# Patient Record
Sex: Female | Born: 1995 | Race: Black or African American | Hispanic: No | Marital: Single | Smoking: Never smoker
Health system: Southern US, Community
[De-identification: ages and names within clinical notes are randomized; demographics above are authoritative.]

## PROBLEM LIST (undated history)

## (undated) ENCOUNTER — Emergency Department (HOSPITAL_COMMUNITY): Admission: EM | Payer: Medicaid Other | Source: Home / Self Care

## (undated) HISTORY — PX: EYE SURGERY: SHX253

---
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
## 2015-04-26 ENCOUNTER — Encounter (HOSPITAL_COMMUNITY): Payer: Self-pay | Admitting: Emergency Medicine

## 2015-04-26 ENCOUNTER — Emergency Department (HOSPITAL_COMMUNITY)
Admission: EM | Admit: 2015-04-26 | Discharge: 2015-04-26 | Disposition: A | Discharge status: Home / Self Care | Payer: Medicaid Other | Attending: Emergency Medicine | Admitting: Emergency Medicine

## 2015-04-26 DIAGNOSIS — Z720 Tobacco use: Secondary | ICD-10-CM | POA: Insufficient documentation

## 2015-04-26 DIAGNOSIS — J069 Acute upper respiratory infection, unspecified: Secondary | ICD-10-CM | POA: Insufficient documentation

## 2015-04-26 DIAGNOSIS — B9789 Other viral agents as the cause of diseases classified elsewhere: Secondary | ICD-10-CM

## 2015-04-26 MED ORDER — OXYMETAZOLINE HCL 0.05 % NA SOLN: 1.0000 | Freq: Two times a day (BID) | NASAL | Status: DC

## 2015-04-26 MED ORDER — IBUPROFEN 600 MG PO TABS: 600.0000 mg | ORAL_TABLET | Freq: Four times a day (QID) | ORAL | Status: DC | PRN

## 2015-04-26 MED ORDER — PSEUDOEPHEDRINE HCL 60 MG PO TABS: 60.0000 mg | ORAL_TABLET | Freq: Four times a day (QID) | ORAL | Status: DC | PRN

## 2015-04-26 NOTE — Discharge Instructions (Signed)
Please read and follow all provided instructions.  Your diagnoses today include:  1. Viral URI with cough    You appear to have an upper respiratory infection (URI). An upper respiratory tract infection, or cold, is a viral infection of the air passages leading to the lungs. It should improve gradually after 5-7 days. You may have a lingering cough that lasts for 2- 4 weeks after the infection.  Tests performed today include:  Vital signs. See below for your results today.   Medications prescribed:   Oxymetazoline - nasal spray for congestion. Do not use for more than 3 days because this medicine can cause rebound congestion.    Ibuprofen (Motrin, Advil) - anti-inflammatory pain medication  Do not exceed 600mg  ibuprofen every 6 hours, take with food  You have been prescribed an anti-inflammatory medication or NSAID. Take with food. Take smallest effective dose for the shortest duration needed for your pain. Stop taking if you experience stomach pain or vomiting.   Take any prescribed medications only as directed. Treatment for your infection is aimed at treating the symptoms. There are no medications, such as antibiotics, that will cure your infection.   Home care instructions:  Follow any educational materials contained in this packet.   Your illness is contagious and can be spread to others, especially during the first 3 or 4 days. It cannot be cured by antibiotics or other medicines. Take basic precautions such as washing your hands often, covering your mouth when you cough or sneeze, and avoiding public places where you could spread your illness to others.   Please continue drinking plenty of fluids.  Use over-the-counter medicines as needed as directed on packaging for symptom relief.  You may also use ibuprofen or tylenol as directed on packaging for pain or fever.  Do not take multiple medicines containing Tylenol or acetaminophen to avoid taking too much of this  medication.  Follow-up instructions: Please follow-up with your primary care provider in the next 3 days for further evaluation of your symptoms if you are not feeling better.   Return instructions:   Please return to the Emergency Department if you experience worsening symptoms.   RETURN IMMEDIATELY IF you develop shortness of breath, confusion or altered mental status, a new rash, become dizzy, faint, or poorly responsive, or are unable to be cared for at home.  Please return if you have persistent vomiting and cannot keep down fluids or develop a fever that is not controlled by tylenol or motrin.    Please return if you have any other emergent concerns.  Additional Information:  Your vital signs today were: BP 124/72 mmHg   Pulse 86   Temp(Src) 98 F (36.7 C) (Oral)   Resp 20   SpO2 100%   LMP 03/29/2015 If your blood pressure (BP) was elevated above 135/85 this visit, please have this repeated by your doctor within one month. --------------

## 2015-04-26 NOTE — ED Notes (Signed)
Pt. Stated, I started having cold symptoms yesterday and now I have a cough.

## 2015-04-26 NOTE — ED Provider Notes (Signed)
CSN: [id number]     Arrival date & time 04/26/15  1557 History  This chart was scribed for non-physician practitioner Renne CriglerJoshua Raahi Korber, PA-C working with Jerelyn ScottMartha Linker, MD by Murriel HopperAlec Bankhead, ED Scribe. This patient was seen in room TR08C/TR08C and the patient's care was started at 5:03 PM.  Chief Complaint  Patient presents with  . Nasal Congestion     The history is provided by the patient. No language interpreter was used.     HPI Comments: Angelica Bowers is a 19 y.o. female who presents to the Emergency Department complaining of constant nasal congestion with associated cough that has been present since yesterday. Pt states she began to have congestion yesterday and took antihistamines twice which provided moderate relief. Pt denies fever, ear pain, sore throat, nausea, vomiting, diarrhea.     History reviewed. No pertinent past medical history. History reviewed. No pertinent past surgical history. No family history on file. History  Substance Use Topics  . Smoking status: Current Some Day Smoker  . Smokeless tobacco: Not on file  . Alcohol Use: No   OB History    No data available     Review of Systems  Constitutional: Negative for fever, chills and fatigue.  HENT: Positive for congestion, postnasal drip and rhinorrhea. Negative for ear pain, sinus pressure and sore throat.   Eyes: Negative for redness.  Respiratory: Positive for cough. Negative for wheezing.   Gastrointestinal: Negative for nausea, vomiting, abdominal pain and diarrhea.  Genitourinary: Negative for dysuria.  Musculoskeletal: Negative for myalgias and neck stiffness.  Skin: Negative for rash.  Neurological: Negative for headaches.  Hematological: Negative for adenopathy.      Allergies  Review of patient's allergies indicates no known allergies.  Home Medications   Prior to Admission medications   Not on File   BP 124/72 mmHg  Pulse 86  Temp(Src) 98 F (36.7 C) (Oral)  Resp 20  SpO2 100%   LMP 03/29/2015 Physical Exam  Constitutional: She is oriented to person, place, and time. She appears well-developed and well-nourished.  HENT:  Head: Normocephalic and atraumatic.  Right Ear: Tympanic membrane, external ear and ear canal normal.  Left Ear: Tympanic membrane, external ear and ear canal normal.  Nose: Mucosal edema and rhinorrhea present.  Mouth/Throat: Uvula is midline, oropharynx is clear and moist and mucous membranes are normal. Mucous membranes are not dry. No oral lesions. No trismus in the jaw. No uvula swelling. No oropharyngeal exudate, posterior oropharyngeal edema, posterior oropharyngeal erythema or tonsillar abscesses.  Eyes: Conjunctivae are normal. Right eye exhibits no discharge. Left eye exhibits no discharge.  Neck: Normal range of motion. Neck supple.  Cardiovascular: Normal rate, regular rhythm and normal heart sounds.   Pulmonary/Chest: Effort normal and breath sounds normal. No respiratory distress. She has no wheezes. She has no rales.  Abdominal: Soft. She exhibits no distension. There is no tenderness.  Lymphadenopathy:    She has no cervical adenopathy.  Neurological: She is alert and oriented to person, place, and time.  Skin: Skin is warm and dry.  Psychiatric: She has a normal mood and affect.  Nursing note and vitals reviewed.   ED Course  Procedures (including critical care time) DIAGNOSTIC STUDIES: Oxygen Saturation is 10% on room air, normal by my interpretation.    COORDINATION OF CARE: 5:09 PM Discussed treatment plan with pt at bedside and pt agreed to plan.   Labs Review Labs Reviewed - No data to display  Imaging Review No results found.  EKG Interpretation None      Vital signs reviewed and are as follows: Filed Vitals:   04/26/15 1701  BP: 124/72  Pulse: 86  Temp: 98 F (36.7 C)  Resp: 20   Patient counseled on supportive care for viral URI and s/s to return including worsening symptoms, persistent fever,  persistent vomiting, or if they have any other concerns. Urged to see PCP if symptoms persist for more than 3 days. Patient verbalizes understanding and agrees with plan.   MDM   Final diagnoses:  Viral URI with cough    Patient with symptoms consistent with a viral syndrome. Vitals are stable, no fever. No signs of dehydration. Lung exam normal, no signs of pneumonia. Supportive therapy indicated with return if symptoms worsen.    I personally performed the services described in this documentation, which was scribed in my presence. The recorded information has been reviewed and is accurate.      Renne Crigler, PA-C 04/26/15 1713  Jerelyn Scott, MD 04/26/15 [phone]

## 2015-04-27 ENCOUNTER — Encounter (HOSPITAL_COMMUNITY): Payer: Self-pay | Admitting: Emergency Medicine

## 2015-04-27 NOTE — ED Notes (Signed)
Pt.left after being triage.

## 2015-04-27 NOTE — ED Notes (Signed)
Patient came in by ambulance for same symptoms as yesterday.   Patient tearful when I asked about what her chief complaint is today.   Patient states that she couldn't afford medication and that she just needs us to give her medication.   Patient states she lives with her grandmother and she can't help her.   Patient states that she will have to take ambulance home today.   Patient advised that she needed to get a ride home.  Patient states "I'm just leaving".   Patient asked if she needed to speak with social work to try and help.  Patient refused.   Patient states "i don't think i'm gonna stay".   Patient still in chair at this time.   Patient refused vitals.

## 2015-04-27 NOTE — ED Notes (Signed)
Per EMS "cold, flu symptoms, tired.   Patient couldn't afford the medications that she was prescribed yesterday, so came back by ambulance today".

## 2016-07-17 ENCOUNTER — Emergency Department (HOSPITAL_COMMUNITY): Payer: Self-pay

## 2016-07-17 ENCOUNTER — Emergency Department (HOSPITAL_COMMUNITY)
Admission: EM | Admit: 2016-07-17 | Discharge: 2016-07-17 | Disposition: A | Discharge status: Home / Self Care | Payer: Self-pay | Attending: Emergency Medicine | Admitting: Emergency Medicine

## 2016-07-17 ENCOUNTER — Encounter (HOSPITAL_COMMUNITY): Payer: Self-pay | Admitting: Emergency Medicine

## 2016-07-17 DIAGNOSIS — M542 Cervicalgia: Secondary | ICD-10-CM | POA: Insufficient documentation

## 2016-07-17 DIAGNOSIS — R109 Unspecified abdominal pain: Secondary | ICD-10-CM | POA: Insufficient documentation

## 2016-07-17 DIAGNOSIS — S0101XA Laceration without foreign body of scalp, initial encounter: Secondary | ICD-10-CM | POA: Insufficient documentation

## 2016-07-17 DIAGNOSIS — Y999 Unspecified external cause status: Secondary | ICD-10-CM | POA: Insufficient documentation

## 2016-07-17 DIAGNOSIS — M79642 Pain in left hand: Secondary | ICD-10-CM | POA: Insufficient documentation

## 2016-07-17 DIAGNOSIS — Y92410 Unspecified street and highway as the place of occurrence of the external cause: Secondary | ICD-10-CM | POA: Insufficient documentation

## 2016-07-17 DIAGNOSIS — Y939 Activity, unspecified: Secondary | ICD-10-CM | POA: Insufficient documentation

## 2016-07-17 DIAGNOSIS — R938 Abnormal findings on diagnostic imaging of other specified body structures: Secondary | ICD-10-CM | POA: Insufficient documentation

## 2016-07-17 DIAGNOSIS — R51 Headache: Secondary | ICD-10-CM | POA: Insufficient documentation

## 2016-07-17 DIAGNOSIS — Z79899 Other long term (current) drug therapy: Secondary | ICD-10-CM | POA: Insufficient documentation

## 2016-07-17 LAB — CBC
HCT: 39.9 % (ref 36.0–46.0)
HEMOGLOBIN: 13.1 g/dL (ref 12.0–15.0)
MCH: 29.9 pg (ref 26.0–34.0)
MCHC: 32.8 g/dL (ref 30.0–36.0)
MCV: 91.1 fL (ref 78.0–100.0)
PLATELETS: 268 10*3/uL (ref 150–400)
RBC: 4.38 MIL/uL (ref 3.87–5.11)
RDW: 13.4 % (ref 11.5–15.5)
WBC: 10.2 10*3/uL (ref 4.0–10.5)

## 2016-07-17 LAB — I-STAT CHEM 8, ED
BUN: 17 mg/dL (ref 6–20)
CALCIUM ION: 1.11 mmol/L — AB (ref 1.13–1.30)
CHLORIDE: 103 mmol/L (ref 101–111)
CREATININE: 1.1 mg/dL — AB (ref 0.44–1.00)
GLUCOSE: 112 mg/dL — AB (ref 65–99)
HCT: 43 % (ref 36.0–46.0)
Hemoglobin: 14.6 g/dL (ref 12.0–15.0)
POTASSIUM: 3.7 mmol/L (ref 3.5–5.1)
Sodium: 143 mmol/L (ref 135–145)
TCO2: 26 mmol/L (ref 0–100)

## 2016-07-17 LAB — ETHANOL

## 2016-07-17 LAB — I-STAT BETA HCG BLOOD, ED (MC, WL, AP ONLY): I-stat hCG, quantitative: <5 m[IU]/mL (ref <5)

## 2016-07-17 LAB — URINALYSIS, ROUTINE W REFLEX MICROSCOPIC
Bilirubin Urine: NEGATIVE
GLUCOSE, UA: NEGATIVE mg/dL
HGB URINE DIPSTICK: NEGATIVE
KETONES UR: NEGATIVE mg/dL
LEUKOCYTES UA: NEGATIVE
Nitrite: NEGATIVE
PROTEIN: NEGATIVE mg/dL
Specific Gravity, Urine: >1.046 — ABNORMAL HIGH (ref 1.005–1.030)
pH: 7 (ref 5.0–8.0)

## 2016-07-17 LAB — COMPREHENSIVE METABOLIC PANEL
ALK PHOS: 32 U/L — AB (ref 38–126)
ALT: 17 U/L (ref 14–54)
ANION GAP: 8 (ref 5–15)
AST: 26 U/L (ref 15–41)
Albumin: 4.4 g/dL (ref 3.5–5.0)
BILIRUBIN TOTAL: 0.3 mg/dL (ref 0.3–1.2)
BUN: 14 mg/dL (ref 6–20)
CALCIUM: 9.1 mg/dL (ref 8.9–10.3)
CO2: 27 mmol/L (ref 22–32)
CREATININE: 1.06 mg/dL — AB (ref 0.44–1.00)
Chloride: 104 mmol/L (ref 101–111)
Glucose, Bld: 115 mg/dL — ABNORMAL HIGH (ref 65–99)
Potassium: 3.8 mmol/L (ref 3.5–5.1)
Sodium: 139 mmol/L (ref 135–145)
TOTAL PROTEIN: 7 g/dL (ref 6.5–8.1)

## 2016-07-17 LAB — RAPID URINE DRUG SCREEN, HOSP PERFORMED
AMPHETAMINES: NOT DETECTED
BARBITURATES: NOT DETECTED
Benzodiazepines: NOT DETECTED
COCAINE: NOT DETECTED
OPIATES: NOT DETECTED
TETRAHYDROCANNABINOL: POSITIVE — AB

## 2016-07-17 LAB — PROTIME-INR
INR: 0.98 (ref 0.00–1.49)
Prothrombin Time: 13.2 seconds (ref 11.6–15.2)

## 2016-07-17 LAB — CDS SEROLOGY

## 2016-07-17 LAB — I-STAT CG4 LACTIC ACID, ED: LACTIC ACID, VENOUS: 3.04 mmol/L — AB (ref 0.5–1.9)

## 2016-07-17 LAB — CBG MONITORING, ED: GLUCOSE-CAPILLARY: 114 mg/dL — AB (ref 65–99)

## 2016-07-17 MED ORDER — HYDROCODONE-ACETAMINOPHEN 5-325 MG PO TABS: 2.0000 | ORAL_TABLET | Freq: Four times a day (QID) | ORAL | 0 refills | Status: DC | PRN

## 2016-07-17 MED ORDER — TETANUS-DIPHTH-ACELL PERTUSSIS 5-2.5-18.5 LF-MCG/0.5 IM SUSP
0.5000 mL | Freq: Once | INTRAMUSCULAR | Status: AC
  Administered 2016-07-17: 0.5 mL via INTRAMUSCULAR
  Filled 2016-07-17: qty 0.5

## 2016-07-17 MED ORDER — FENTANYL CITRATE (PF) 100 MCG/2ML IJ SOLN
INTRAMUSCULAR | Status: DC | PRN
  Administered 2016-07-17 (×2): 50 ug via INTRAVENOUS

## 2016-07-17 MED ORDER — LIDOCAINE HCL (PF) 1 % IJ SOLN: 5.0000 mL | Freq: Once | INTRAMUSCULAR | Status: DC

## 2016-07-17 MED ORDER — LIDOCAINE-EPINEPHRINE (PF) 2 %-1:200000 IJ SOLN
20.0000 mL | Freq: Once | INTRAMUSCULAR | Status: AC
  Administered 2016-07-17: 20 mL
  Filled 2016-07-17: qty 20

## 2016-07-17 MED ORDER — LIDOCAINE HCL (PF) 1 % IJ SOLN
INTRAMUSCULAR | Status: AC
  Filled 2016-07-17: qty 5

## 2016-07-17 MED ORDER — FENTANYL CITRATE (PF) 100 MCG/2ML IJ SOLN
50.0000 ug | Freq: Once | INTRAMUSCULAR | Status: AC
  Administered 2016-07-17: 50 ug via INTRAVENOUS

## 2016-07-17 MED ORDER — IOPAMIDOL (ISOVUE-370) INJECTION 76%
INTRAVENOUS | Status: AC
  Filled 2016-07-17: qty 100

## 2016-07-17 MED ORDER — IOPAMIDOL (ISOVUE-370) INJECTION 76%
INTRAVENOUS | Status: AC
  Administered 2016-07-17: 120 mL
  Filled 2016-07-17: qty 50

## 2016-07-17 MED ORDER — SODIUM CHLORIDE 0.9 % IV BOLUS (SEPSIS)
1000.0000 mL | Freq: Once | INTRAVENOUS | Status: AC
  Administered 2016-07-17: 1000 mL via INTRAVENOUS

## 2016-07-17 MED ORDER — ONDANSETRON HCL 4 MG/2ML IJ SOLN
4.0000 mg | Freq: Once | INTRAMUSCULAR | Status: AC
  Administered 2016-07-17: 4 mg via INTRAVENOUS

## 2016-07-17 MED ORDER — MORPHINE SULFATE (PF) 4 MG/ML IV SOLN: 4.0000 mg | Freq: Once | INTRAVENOUS | Status: DC

## 2016-07-17 MED ORDER — FENTANYL CITRATE (PF) 100 MCG/2ML IJ SOLN
INTRAMUSCULAR | Status: AC
  Filled 2016-07-17: qty 2

## 2016-07-17 MED ORDER — ONDANSETRON HCL 4 MG/2ML IJ SOLN
INTRAMUSCULAR | Status: AC
  Administered 2016-07-17: 4 mg via INTRAVENOUS
  Filled 2016-07-17: qty 2

## 2016-07-17 NOTE — Progress Notes (Signed)
Orthopedic Tech Progress Note Patient Details:  Angelica Bowers [birth year] [id number]  Patient ID: [name], female   DOB: [birth year], 20 y.o.   MRN: [id number]   Saul Fordyce 07/17/2016, 4:37 PM Reported to Level 2 Trauma.

## 2016-07-17 NOTE — ED Notes (Signed)
Family at beside. Family given emotional support. 
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
## 2016-07-17 NOTE — ED Notes (Signed)
Resident suturing and fixing teeth.
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
## 2016-07-18 LAB — SAMPLE TO BLOOD BANK

## 2016-07-19 ENCOUNTER — Encounter (HOSPITAL_COMMUNITY): Payer: Self-pay | Admitting: Emergency Medicine

## 2016-09-12 ENCOUNTER — Ambulatory Visit (INDEPENDENT_AMBULATORY_CARE_PROVIDER_SITE_OTHER): Payer: Self-pay

## 2016-09-12 ENCOUNTER — Encounter: Payer: Self-pay | Admitting: Obstetrics and Gynecology

## 2016-09-12 ENCOUNTER — Encounter (HOSPITAL_COMMUNITY): Payer: Self-pay | Admitting: *Deleted

## 2016-09-12 ENCOUNTER — Inpatient Hospital Stay (HOSPITAL_COMMUNITY)
Admission: AD | Admit: 2016-09-12 | Discharge: 2016-09-12 | Disposition: A | Discharge status: Home / Self Care | Payer: Medicaid Other | Source: Ambulatory Visit | Attending: Obstetrics and Gynecology | Admitting: Obstetrics and Gynecology

## 2016-09-12 DIAGNOSIS — Z711 Person with feared health complaint in whom no diagnosis is made: Secondary | ICD-10-CM

## 2016-09-12 DIAGNOSIS — N926 Irregular menstruation, unspecified: Secondary | ICD-10-CM

## 2016-09-12 DIAGNOSIS — N912 Amenorrhea, unspecified: Secondary | ICD-10-CM | POA: Insufficient documentation

## 2016-09-12 DIAGNOSIS — Z3200 Encounter for pregnancy test, result unknown: Secondary | ICD-10-CM

## 2016-09-12 LAB — POCT PREGNANCY, URINE: PREG TEST UR: POSITIVE — AB

## 2016-09-12 NOTE — MAU Note (Signed)
Pt states she is pregnant, pos HPT, was seen by PCP recently & confirmed pregnancy.  Denies lower abd pain or bleeding, has breast soreness.  Wants to start prenatal care.

## 2016-09-12 NOTE — MAU Provider Note (Signed)
S:  Angelica Bowers is a 20 y.o. here for confirmation of pregnancy.  Patient's Patient's last menstrual period was 07/25/2016 (approximate)..  Denies any vaginal bleeding or abdominal pain. Undecided where she will get prenatal care. Had positive UPT at home.   O:  No past medical history on file.  No family history on file.   Vitals:   09/12/16 1104  BP: 109/73  Pulse: 76  Resp: 18  Temp: 98.5 F (36.9 C)   General:  A&OX3 with no signs of acute distress. She appears well-developed and well-nourished. No distress.  Neck: Normal range of motion.  Pulmonary/Chest: Effort normal. No respiratory distress.  Musculoskeletal: Normal range of motion.  Neurological: She is alert and oriented to person, place, and time.  Skin: Skin is warm and dry.   A: 1. Missed menses   2. Physically well but worried     P: Informed that we do not do pregnancy verifications through the MAU [facility]  Judeth HornErin Briel Gallicchio, NP

## 2016-09-12 NOTE — Discharge Instructions (Signed)
First Trimester of Pregnancy The first trimester of pregnancy is from week 1 until the end of week 12 (months 1 through 3). A week after a sperm fertilizes an egg, the egg will implant on the wall of the uterus. This embryo will begin to develop into a baby. Genes from you and your partner are forming the baby. The female genes determine whether the baby is a boy or a girl. At 6-8 weeks, the eyes and face are formed, and the heartbeat can be seen on ultrasound. At the end of 12 weeks, all the baby's organs are formed.  Now that you are pregnant, you will want to do everything you can to have a healthy baby. Two of the most important things are to get good prenatal care and to follow your health care provider's instructions. Prenatal care is all the medical care you receive before the baby's birth. This care will help prevent, find, and treat any problems during the pregnancy and childbirth. BODY CHANGES Your body goes through many changes during pregnancy. The changes vary from woman to woman.   You may gain or lose a couple of pounds at first.  You may feel sick to your stomach (nauseous) and throw up (vomit). If the vomiting is uncontrollable, call your health care provider.  You may tire easily.  You may develop headaches that can be relieved by medicines approved by your health care provider.  You may urinate more often. Painful urination may mean you have a bladder infection.  You may develop heartburn as a result of your pregnancy.  You may develop constipation because certain hormones are causing the muscles that push waste through your intestines to slow down.  You may develop hemorrhoids or swollen, bulging veins (varicose veins).  Your breasts may begin to grow larger and become tender. Your nipples may stick out more, and the tissue that surrounds them (areola) may become darker.  Your gums may bleed and may be sensitive to brushing and flossing.  Dark spots or blotches (chloasma,  mask of pregnancy) may develop on your face. This will likely fade after the baby is born.  Your menstrual periods will stop.  You may have a loss of appetite.  You may develop cravings for certain kinds of food.  You may have changes in your emotions from day to day, such as being excited to be pregnant or being concerned that something may go wrong with the pregnancy and baby.  You may have more vivid and strange dreams.  You may have changes in your hair. These can include thickening of your hair, rapid growth, and changes in texture. Some women also have hair loss during or after pregnancy, or hair that feels dry or thin. Your hair will most likely return to normal after your baby is born. WHAT TO EXPECT AT YOUR PRENATAL VISITS During a routine prenatal visit:  You will be weighed to make sure you and the baby are growing normally.  Your blood pressure will be taken.  Your abdomen will be measured to track your baby's growth.  The fetal heartbeat will be listened to starting around week 10 or 12 of your pregnancy.  Test results from any previous visits will be discussed. Your health care provider may ask you:  How you are feeling.  If you are feeling the baby move.  If you have had any abnormal symptoms, such as leaking fluid, bleeding, severe headaches, or abdominal cramping.  If you are using any tobacco products,   including cigarettes, chewing tobacco, and electronic cigarettes.  If you have any questions. Other tests that may be performed during your first trimester include:  Blood tests to find your blood type and to check for the presence of any previous infections. They will also be used to check for low iron levels (anemia) and Rh antibodies. Later in the pregnancy, blood tests for diabetes will be done along with other tests if problems develop.  Urine tests to check for infections, diabetes, or protein in the urine.  An ultrasound to confirm the proper growth  and development of the baby.  An amniocentesis to check for possible genetic problems.  Fetal screens for spina bifida and Down syndrome.  You may need other tests to make sure you and the baby are doing well.  HIV (human immunodeficiency virus) testing. Routine prenatal testing includes screening for HIV, unless you choose not to have this test. HOME CARE INSTRUCTIONS  Medicines  Follow your health care provider's instructions regarding medicine use. Specific medicines may be either safe or unsafe to take during pregnancy.  Take your prenatal vitamins as directed.  If you develop constipation, try taking a stool softener if your health care provider approves. Diet  Eat regular, well-balanced meals. Choose a variety of foods, such as meat or vegetable-based protein, fish, milk and low-fat dairy products, vegetables, fruits, and whole grain breads and cereals. Your health care provider will help you determine the amount of weight gain that is right for you.  Avoid raw meat and uncooked cheese. These carry germs that can cause birth defects in the baby.  Eating four or five small meals rather than three large meals a day may help relieve nausea and vomiting. If you start to feel nauseous, eating a few soda crackers can be helpful. Drinking liquids between meals instead of during meals also seems to help nausea and vomiting.  If you develop constipation, eat more high-fiber foods, such as fresh vegetables or fruit and whole grains. Drink enough fluids to keep your urine clear or pale yellow. Activity and Exercise  Exercise only as directed by your health care provider. Exercising will help you:  Control your weight.  Stay in shape.  Be prepared for labor and delivery.  Experiencing pain or cramping in the lower abdomen or low back is a good sign that you should stop exercising. Check with your health care provider before continuing normal exercises.  Try to avoid standing for long  periods of time. Move your legs often if you must stand in one place for a long time.  Avoid heavy lifting.  Wear low-heeled shoes, and practice good posture.  You may continue to have sex unless your health care provider directs you otherwise. Relief of Pain or Discomfort  Wear a good support bra for breast tenderness.   Take warm sitz baths to soothe any pain or discomfort caused by hemorrhoids. Use hemorrhoid cream if your health care provider approves.   Rest with your legs elevated if you have leg cramps or low back pain.  If you develop varicose veins in your legs, wear support hose. Elevate your feet for 15 minutes, 3-4 times a day. Limit salt in your diet. Prenatal Care  Schedule your prenatal visits by the twelfth week of pregnancy. They are usually scheduled monthly at first, then more often in the last 2 months before delivery.  Write down your questions. Take them to your prenatal visits.  Keep all your prenatal visits as directed by your   health care provider. Safety  Wear your seat belt at all times when driving.  Make a list of emergency phone numbers, including numbers for family, friends, the hospital, and police and fire departments. General Tips  Ask your health care provider for a referral to a local prenatal education class. Begin classes no later than at the beginning of month 6 of your pregnancy.  Ask for help if you have counseling or nutritional needs during pregnancy. Your health care provider can offer advice or refer you to specialists for help with various needs.  Do not use hot tubs, steam rooms, or saunas.  Do not douche or use tampons or scented sanitary pads.  Do not cross your legs for long periods of time.  Avoid cat litter boxes and soil used by cats. These carry germs that can cause birth defects in the baby and possibly loss of the fetus by miscarriage or stillbirth.  Avoid all smoking, herbs, alcohol, and medicines not prescribed by  your health care provider. Chemicals in these affect the formation and growth of the baby.  Do not use any tobacco products, including cigarettes, chewing tobacco, and electronic cigarettes. If you need help quitting, ask your health care provider. You may receive counseling support and other resources to help you quit.  Schedule a dentist appointment. At home, brush your teeth with a soft toothbrush and be gentle when you floss. SEEK MEDICAL CARE IF:   You have dizziness.  You have mild pelvic cramps, pelvic pressure, or nagging pain in the abdominal area.  You have persistent nausea, vomiting, or diarrhea.  You have a bad smelling vaginal discharge.  You have pain with urination.  You notice increased swelling in your face, hands, legs, or ankles. SEEK IMMEDIATE MEDICAL CARE IF:   You have a fever.  You are leaking fluid from your vagina.  You have spotting or bleeding from your vagina.  You have severe abdominal cramping or pain.  You have rapid weight gain or loss.  You vomit blood or material that looks like coffee grounds.  You are exposed to German measles and have never had them.  You are exposed to fifth disease or chickenpox.  You develop a severe headache.  You have shortness of breath.  You have any kind of trauma, such as from a fall or a car accident.   This information is not intended to replace advice given to you by your health care provider. Make sure you discuss any questions you have with your health care provider.   Document Released: 12/05/2001 Document Revised: 01/01/2015 Document Reviewed: 10/21/2013 Elsevier Interactive Patient Education 2016 [employer]  

## 2016-09-12 NOTE — Progress Notes (Signed)
Patient presented to clinic today for a pregnancy test. Test confirms she is pregnant around 11 weeks. Patient have been given a letter of verification to apply for assistance. Medications have been reviewed at this time.

## 2016-09-27 ENCOUNTER — Encounter: Payer: Self-pay | Admitting: Family Medicine

## 2016-09-27 ENCOUNTER — Ambulatory Visit (INDEPENDENT_AMBULATORY_CARE_PROVIDER_SITE_OTHER): Payer: Self-pay | Admitting: Family Medicine

## 2016-09-27 ENCOUNTER — Other Ambulatory Visit: Payer: Self-pay | Admitting: Family Medicine

## 2016-09-27 VITALS — BP 117/59 | HR 77 | Wt 195.0 lb

## 2016-09-27 DIAGNOSIS — O0993 Supervision of high risk pregnancy, unspecified, third trimester: Secondary | ICD-10-CM | POA: Insufficient documentation

## 2016-09-27 DIAGNOSIS — Z3491 Encounter for supervision of normal pregnancy, unspecified, first trimester: Secondary | ICD-10-CM

## 2016-09-27 DIAGNOSIS — Z3490 Encounter for supervision of normal pregnancy, unspecified, unspecified trimester: Secondary | ICD-10-CM

## 2016-09-27 DIAGNOSIS — Z113 Encounter for screening for infections with a predominantly sexual mode of transmission: Secondary | ICD-10-CM

## 2016-09-27 DIAGNOSIS — Z3400 Encounter for supervision of normal first pregnancy, unspecified trimester: Secondary | ICD-10-CM

## 2016-09-27 DIAGNOSIS — Z3689 Encounter for other specified antenatal screening: Secondary | ICD-10-CM

## 2016-09-27 MED ORDER — PRENATAL 27-0.8 MG PO TABS: 1.0000 | ORAL_TABLET | Freq: Every day | ORAL | 10 refills | Status: DC

## 2016-09-27 NOTE — Progress Notes (Signed)
Bedside ultrasound reveals CRL: 3.43 c/w 9weeks 2 days. Fetal heart rate 170bpm

## 2016-09-27 NOTE — Progress Notes (Signed)
  Subjective:    Angelica Bowers a G1P0 6359w1d being seen today for her first obstetrical visit.  Her pregnancy was not intentional. FOB not involved. Her obstetrical history is otherwise insignificant. Patient does not intend to breast feed. Pregnancy history fully reviewed.  Patient reports nausea.  Vitals:   09/27/16 1555  BP: (!) 117/59  Pulse: 77  Weight: 195 lb (88.5 kg)    HISTORY: OB History  Gravida Para Term Preterm AB Living  1            SAB TAB Ectopic Multiple Live Births               # Outcome Date GA Lbr Len/2nd Weight Sex Delivery Anes PTL Lv  1 Current              History reviewed. No pertinent past medical history. History reviewed. No pertinent surgical history. Family History  Problem Relation Age of Onset  . Cancer Neg Hx   . Diabetes Neg Hx   . Hypertension Neg Hx      Exam    Uterus:     System:     Skin: normal coloration and turgor, no rashes    Neurologic: gait normal; reflexes normal and symmetric   Extremities: normal strength, tone, and muscle mass   HEENT PERRLA and extra ocular movement intact   Mouth/Teeth mucous membranes moist, pharynx normal without lesions   Neck supple and no masses   Cardiovascular: regular rate and rhythm, no murmurs or gallops   Respiratory:  appears well, vitals normal, no respiratory distress, acyanotic, normal RR, ear and throat exam is normal, neck free of mass or lymphadenopathy, chest clear, no wheezing, crepitations, rhonchi, normal symmetric air entry   Abdomen: soft, non-tender; bowel sounds normal; no masses,  no organomegaly      Assessment:    Pregnancy: G1P0 Patient Active Problem List   Diagnosis Date Noted  . Supervision of normal first pregnancy, antepartum 09/27/2016        Plan:     Initial labs drawn. Prenatal vitamins. Problem list reviewed and updated. Genetic Screening discussed First Screen: ordered.  Ultrasound discussed; fetal survey: requested.  Follow up in  4 weeks. 50% of 30 min visit spent on counseling and coordination of care.     Angelica Bowers, Angelica Bowers 09/27/2016

## 2016-09-28 LAB — GC/CHLAMYDIA PROBE AMP
CT Probe RNA: NOT DETECTED
GC PROBE AMP APTIMA: NOT DETECTED

## 2016-09-28 LAB — SICKLE CELL SCREEN: Sickle Cell Screen: NEGATIVE

## 2016-09-28 LAB — OBSTETRIC PANEL
Antibody Screen: NEGATIVE
Basophils Absolute: 0 cells/uL (ref 0–200)
Basophils Relative: 0 %
Eosinophils Absolute: 184 cells/uL (ref 15–500)
Eosinophils Relative: 2 %
HCT: 35.1 % (ref 35.0–45.0)
HEP B S AG: NEGATIVE
Hemoglobin: 11.6 g/dL — ABNORMAL LOW (ref 11.7–15.5)
LYMPHS ABS: 1472 {cells}/uL (ref 850–3900)
Lymphocytes Relative: 16 %
MCH: 29.6 pg (ref 27.0–33.0)
MCHC: 33 g/dL (ref 32.0–36.0)
MCV: 89.5 fL (ref 80.0–100.0)
MONO ABS: 644 {cells}/uL (ref 200–950)
MPV: 10.3 fL (ref 7.5–12.5)
Monocytes Relative: 7 %
Neutro Abs: 6900 cells/uL (ref 1500–7800)
Neutrophils Relative %: 75 %
PLATELETS: 298 10*3/uL (ref 140–400)
RBC: 3.92 MIL/uL (ref 3.80–5.10)
RDW: 13.3 % (ref 11.0–15.0)
Rh Type: POSITIVE
Rubella: 12.8 Index — ABNORMAL HIGH (ref <0.90)
WBC: 9.2 10*3/uL (ref 3.8–10.8)

## 2016-09-28 LAB — HIV ANTIBODY (ROUTINE TESTING W REFLEX): HIV 1&2 Ab, 4th Generation: NONREACTIVE

## 2016-09-29 LAB — CULTURE, URINE COMPREHENSIVE

## 2016-09-30 LAB — PAIN MGMT, OPIATES EXP. QN, UR
Codeine: NEGATIVE ng/mL (ref <50)
HYDROCODONE: NEGATIVE ng/mL (ref <50)
Hydromorphone: NEGATIVE ng/mL (ref <50)
Morphine: NEGATIVE ng/mL (ref <50)
NORHYDROCODONE: NEGATIVE ng/mL (ref <50)
NOROXYCODONE: NEGATIVE ng/mL (ref <50)
Oxycodone: NEGATIVE ng/mL (ref <50)
Oxymorphone: NEGATIVE ng/mL (ref <50)

## 2016-10-17 ENCOUNTER — Encounter (HOSPITAL_COMMUNITY): Payer: Self-pay | Admitting: Family Medicine

## 2016-10-24 ENCOUNTER — Other Ambulatory Visit: Payer: Self-pay | Admitting: Family Medicine

## 2016-10-24 ENCOUNTER — Ambulatory Visit (HOSPITAL_COMMUNITY)
Admission: RE | Admit: 2016-10-24 | Discharge: 2016-10-24 | Disposition: A | Discharge status: Home / Self Care | Payer: Medicaid Other | Source: Ambulatory Visit | Attending: Family Medicine | Admitting: Family Medicine

## 2016-10-24 ENCOUNTER — Ambulatory Visit (HOSPITAL_COMMUNITY): Admission: RE | Admit: 2016-10-24 | Payer: Medicaid Other | Source: Ambulatory Visit

## 2016-10-24 ENCOUNTER — Encounter (HOSPITAL_COMMUNITY): Payer: Self-pay

## 2016-10-24 DIAGNOSIS — Z363 Encounter for antenatal screening for malformations: Secondary | ICD-10-CM

## 2016-10-24 DIAGNOSIS — O0932 Supervision of pregnancy with insufficient antenatal care, second trimester: Secondary | ICD-10-CM

## 2016-10-24 DIAGNOSIS — Z3A15 15 weeks gestation of pregnancy: Secondary | ICD-10-CM | POA: Insufficient documentation

## 2016-10-24 DIAGNOSIS — Z3687 Encounter for antenatal screening for uncertain dates: Secondary | ICD-10-CM | POA: Insufficient documentation

## 2016-10-24 DIAGNOSIS — Z3400 Encounter for supervision of normal first pregnancy, unspecified trimester: Secondary | ICD-10-CM

## 2016-10-25 ENCOUNTER — Other Ambulatory Visit (HOSPITAL_COMMUNITY): Payer: Self-pay | Admitting: *Deleted

## 2016-10-25 DIAGNOSIS — Z0489 Encounter for examination and observation for other specified reasons: Secondary | ICD-10-CM

## 2016-10-25 DIAGNOSIS — IMO0002 Reserved for concepts with insufficient information to code with codable children: Secondary | ICD-10-CM

## 2016-10-26 ENCOUNTER — Encounter: Payer: Medicaid Other | Admitting: Obstetrics & Gynecology

## 2016-11-21 ENCOUNTER — Ambulatory Visit (HOSPITAL_COMMUNITY)
Admission: RE | Admit: 2016-11-21 | Discharge: 2016-11-21 | Disposition: A | Discharge status: Home / Self Care | Payer: Medicaid Other | Source: Ambulatory Visit | Attending: Family Medicine | Admitting: Family Medicine

## 2016-11-21 DIAGNOSIS — Z3A19 19 weeks gestation of pregnancy: Secondary | ICD-10-CM | POA: Insufficient documentation

## 2016-11-21 DIAGNOSIS — IMO0002 Reserved for concepts with insufficient information to code with codable children: Secondary | ICD-10-CM

## 2016-11-21 DIAGNOSIS — Z362 Encounter for other antenatal screening follow-up: Secondary | ICD-10-CM | POA: Insufficient documentation

## 2016-11-21 DIAGNOSIS — Z0489 Encounter for examination and observation for other specified reasons: Secondary | ICD-10-CM

## 2016-12-11 ENCOUNTER — Ambulatory Visit (INDEPENDENT_AMBULATORY_CARE_PROVIDER_SITE_OTHER): Payer: Self-pay | Admitting: Obstetrics & Gynecology

## 2016-12-11 VITALS — BP 125/63 | HR 86 | Wt 210.0 lb

## 2016-12-11 DIAGNOSIS — Z3402 Encounter for supervision of normal first pregnancy, second trimester: Secondary | ICD-10-CM

## 2016-12-11 DIAGNOSIS — Z3400 Encounter for supervision of normal first pregnancy, unspecified trimester: Secondary | ICD-10-CM

## 2016-12-11 MED ORDER — PRENATAL 27-0.8 MG PO TABS: 1.0000 | ORAL_TABLET | Freq: Every day | ORAL | 10 refills | Status: DC

## 2016-12-11 NOTE — Progress Notes (Signed)
   PRENATAL VISIT NOTE  Subjective:  Angelica Bowers is a 20 y.o. G1P0 at 5865w6d being seen today for ongoing prenatal care.  She is currently monitored for the following issues for this low-risk pregnancy and has Supervision of normal first pregnancy, antepartum on her problem list.  Patient reports no complaints.   . Vag. Bleeding: None.  Movement: Present. Denies leaking of fluid.   The following portions of the patient's history were reviewed and updated as appropriate: allergies, current medications, past family history, past medical history, past social history, past surgical history and problem list. Problem list updated.  Objective:   Vitals:   12/11/16 0843  BP: 125/63  Pulse: 86  Weight: 210 lb (95.3 kg)    Fetal Status: Fetal Heart Rate (bpm): 150   Movement: Present     General:  Alert, oriented and cooperative. Patient is in no acute distress.  Skin: Skin is warm and dry. No rash noted.   Cardiovascular: Normal heart rate noted  Respiratory: Normal respiratory effort, no problems with respiration noted  Abdomen: Soft, gravid, appropriate for gestational age. Pain/Pressure: Absent     Pelvic:  Cervical exam deferred        Extremities: Normal range of motion.  Edema: None  Mental Status: Normal mood and affect. Normal behavior. Normal judgment and thought content.   Assessment and Plan:  Pregnancy: G1P0 at 5765w6d  1. Supervision of normal first pregnancy, antepartum Quad screen today Pt declined flu vaccine  Preterm labor symptoms and general obstetric precautions including but not limited to vaginal bleeding, contractions, leaking of fluid and fetal movement were reviewed in detail with the patient. Please refer to After Visit Summary for other counseling recommendations.  Return in about 4 weeks (around 01/08/2017).   Willodean Rosenthalarolyn Harraway-Smith, MD

## 2016-12-11 NOTE — Addendum Note (Signed)
Addended by: Anell BarrHOWARD, JENNIFER L on: 12/11/2016 09:18 AM   Modules accepted: Orders

## 2016-12-11 NOTE — Patient Instructions (Signed)
Second Trimester of Pregnancy The second trimester is from week 13 through week 28 (months 4 through 6). The second trimester is often a time when you feel your best. Your body has also adjusted to being pregnant, and you begin to feel better physically. Usually, morning sickness has lessened or quit completely, you may have more energy, and you may have an increase in appetite. The second trimester is also a time when the fetus is growing rapidly. At the end of the sixth month, the fetus is about 9 inches long and weighs about 1 pounds. You will likely begin to feel the baby move (quickening) between 18 and 20 weeks of the pregnancy. Body changes during your second trimester Your body continues to go through many changes during your second trimester. The changes vary from woman to woman.  Your weight will continue to increase. You will notice your lower abdomen bulging out.  You may begin to get stretch marks on your hips, abdomen, and breasts.  You may develop headaches that can be relieved by medicines. The medicines should be approved by your health care provider.  You may urinate more often because the fetus is pressing on your bladder.  You may develop or continue to have heartburn as a result of your pregnancy.  You may develop constipation because certain hormones are causing the muscles that push waste through your intestines to slow down.  You may develop hemorrhoids or swollen, bulging veins (varicose veins).  You may have back pain. This is caused by:  Weight gain.  Pregnancy hormones that are relaxing the joints in your pelvis.  A shift in weight and the muscles that support your balance.  Your breasts will continue to grow and they will continue to become tender.  Your gums may bleed and may be sensitive to brushing and flossing.  Dark spots or blotches (chloasma, mask of pregnancy) may develop on your face. This will likely fade after the baby is born.  A dark line  from your belly button to the pubic area (linea nigra) may appear. This will likely fade after the baby is born.  You may have changes in your hair. These can include thickening of your hair, rapid growth, and changes in texture. Some women also have hair loss during or after pregnancy, or hair that feels dry or thin. Your hair will most likely return to normal after your baby is born. What to expect at prenatal visits During a routine prenatal visit:  You will be weighed to make sure you and the fetus are growing normally.  Your blood pressure will be taken.  Your abdomen will be measured to track your baby's growth.  The fetal heartbeat will be listened to.  Any test results from the previous visit will be discussed. Your health care provider may ask you:  How you are feeling.  If you are feeling the baby move.  If you have had any abnormal symptoms, such as leaking fluid, bleeding, severe headaches, or abdominal cramping.  If you are using any tobacco products, including cigarettes, chewing tobacco, and electronic cigarettes.  If you have any questions. Other tests that may be performed during your second trimester include:  Blood tests that check for:  Low iron levels (anemia).  Gestational diabetes (between 24 and 28 weeks).  Rh antibodies. This is to check for a protein on red blood cells (Rh factor).  Urine tests to check for infections, diabetes, or protein in the urine.  An ultrasound to   confirm the proper growth and development of the baby.  An amniocentesis to check for possible genetic problems.  Fetal screens for spina bifida and Down syndrome.  HIV (human immunodeficiency virus) testing. Routine prenatal testing includes screening for HIV, unless you choose not to have this test. Follow these instructions at home: Eating and drinking  Continue to eat regular, healthy meals.  Avoid raw meat, uncooked cheese, cat litter boxes, and soil used by cats. These  carry germs that can cause birth defects in the baby.  Take your prenatal vitamins.  Take 1500-2000 mg of calcium daily starting at the 20th week of pregnancy until you deliver your baby.  If you develop constipation:  Take over-the-counter or prescription medicines.  Drink enough fluid to keep your urine clear or pale yellow.  Eat foods that are high in fiber, such as fresh fruits and vegetables, whole grains, and beans.  Limit foods that are high in fat and processed sugars, such as fried and sweet foods. Activity  Exercise only as directed by your health care provider. Experiencing uterine cramps is a good sign to stop exercising.  Avoid heavy lifting, wear low heel shoes, and practice good posture.  Wear your seat belt at all times when driving.  Rest with your legs elevated if you have leg cramps or low back pain.  Wear a good support bra for breast tenderness.  Do not use hot tubs, steam rooms, or saunas. Lifestyle  Avoid all smoking, herbs, alcohol, and unprescribed drugs. These chemicals affect the formation and growth of the baby.  Do not use any products that contain nicotine or tobacco, such as cigarettes and e-cigarettes. If you need help quitting, ask your health care provider.  A sexual relationship may be continued unless your health care provider directs you otherwise. General instructions  Follow your health care provider's instructions regarding medicine use. There are medicines that are either safe or unsafe to take during pregnancy.  Take warm sitz baths to soothe any pain or discomfort caused by hemorrhoids. Use hemorrhoid cream if your health care provider approves.  If you develop varicose veins, wear support hose. Elevate your feet for 15 minutes, 3-4 times a day. Limit salt in your diet.  Visit your dentist if you have not gone yet during your pregnancy. Use a soft toothbrush to brush your teeth and be gentle when you floss.  Keep all follow-up  prenatal visits as told by your health care provider. This is important. Contact a health care provider if:  You have dizziness.  You have mild pelvic cramps, pelvic pressure, or nagging pain in the abdominal area.  You have persistent nausea, vomiting, or diarrhea.  You have a bad smelling vaginal discharge.  You have pain with urination. Get help right away if:  You have a fever.  You are leaking fluid from your vagina.  You have spotting or bleeding from your vagina.  You have severe abdominal cramping or pain.  You have rapid weight gain or weight loss.  You have shortness of breath with chest pain.  You notice sudden or extreme swelling of your face, hands, ankles, feet, or legs.  You have not felt your baby move in over an hour.  You have severe headaches that do not go away with medicine.  You have vision changes. Summary  The second trimester is from week 13 through week 28 (months 4 through 6). It is also a time when the fetus is growing rapidly.  Your body goes   through many changes during pregnancy. The changes vary from woman to woman.  Avoid all smoking, herbs, alcohol, and unprescribed drugs. These chemicals affect the formation and growth your baby.  Do not use any tobacco products, such as cigarettes, chewing tobacco, and e-cigarettes. If you need help quitting, ask your health care provider.  Contact your health care provider if you have any questions. Keep all prenatal visits as told by your health care provider. This is important. This information is not intended to replace advice given to you by your health care provider. Make sure you discuss any questions you have with your health care provider. Document Released: 12/05/2001 Document Revised: 05/18/2016 Document Reviewed: 02/11/2013 [employer] Interactive Patient Education  2017 [employer]  

## 2016-12-12 LAB — AFP, QUAD SCREEN
AFP: 60.2 ng/mL
Curr Gest Age: 19.9 weeks
Down Syndrome Scr Risk Est: <1:38500 {titer}
HCG TOTAL: 13.54 [IU]/mL
INH: 219.7 pg/mL
INTERPRETATION-AFP: NEGATIVE
MOM FOR AFP: 1.34
MoM for INH: 1.44
MoM for hCG: 0.75
OPEN SPINA BIFIDA: NEGATIVE
Tri 18 Scr Risk Est: NEGATIVE
Trisomy 18 (Edward) Syndrome Interp.: <1:97100 {titer}
UE3 MOM: 1.54
uE3 Value: 2.46 ng/mL

## 2016-12-25 NOTE — L&D Delivery Note (Signed)
21 y.o. G1P0 at [redacted]w[redacted]d admitted for IOL for Polyhydramnios delivered a viable female infant in cephalic, ROA position.  Cord clamped x2 and cut after 60 sec delay. Placenta delivered spontaneously intact, with 3VC. Fundus firm on exam with massage and pitocin. Good hemostasis noted.  Anesthesia: Epidural Laceration: shallow anterior and posterior vaginal bruises that are hemostatic Suture: None Good hemostasis noted. EBL: 150 cc  Mom and baby recovering in LDR.    Apgars: APGAR (1 MIN): 7   APGAR (5 MINS): 9   Weight: Pending skin to skin  Patient is a G1 at [redacted]w[redacted]d who was admitted for IOL for previous polyhydramnios with most recent AFI 19.6cm, otherwise uncomplicated prenatal course.  She progressed with augmentation via cytotec/Pit/AROM.  I was gloved and present for delivery in its entirety.  Second stage of labor progressed to SVD.  Mild decels during second stage noted.  Complications: none  Lacerations: none  EBL: 150cc  Angelica Bowers, CNM 9:29 AM  04/07/2017

## 2017-01-11 ENCOUNTER — Encounter: Payer: Medicaid Other | Admitting: Obstetrics & Gynecology

## 2017-01-22 ENCOUNTER — Ambulatory Visit (INDEPENDENT_AMBULATORY_CARE_PROVIDER_SITE_OTHER): Payer: Self-pay | Admitting: Family Medicine

## 2017-01-22 VITALS — BP 117/65 | HR 103 | Wt 222.0 lb

## 2017-01-22 DIAGNOSIS — Z3400 Encounter for supervision of normal first pregnancy, unspecified trimester: Secondary | ICD-10-CM

## 2017-01-22 DIAGNOSIS — Z3402 Encounter for supervision of normal first pregnancy, second trimester: Secondary | ICD-10-CM

## 2017-01-22 NOTE — Patient Instructions (Signed)
Breastfeeding Deciding to breastfeed is one of the best choices you can make for you and your baby. A change in hormones during pregnancy causes your breast tissue to grow and increases the number and size of your milk ducts. These hormones also allow proteins, sugars, and fats from your blood supply to make breast milk in your milk-producing glands. Hormones prevent breast milk from being released before your baby is born as well as prompt milk flow after birth. Once breastfeeding has begun, thoughts of your baby, as well as his or her sucking or crying, can stimulate the release of milk from your milk-producing glands. Benefits of breastfeeding For Your Baby  Your first milk (colostrum) helps your baby's digestive system function better.  There are antibodies in your milk that help your baby fight off infections.  Your baby has a lower incidence of asthma, allergies, and sudden infant death syndrome.  The nutrients in breast milk are better for your baby than infant formulas and are designed uniquely for your baby's needs.  Breast milk improves your baby's brain development.  Your baby is less likely to develop other conditions, such as childhood obesity, asthma, or type 2 diabetes mellitus.  For You  Breastfeeding helps to create a very special bond between you and your baby.  Breastfeeding is convenient. Breast milk is always available at the correct temperature and costs nothing.  Breastfeeding helps to burn calories and helps you lose the weight gained during pregnancy.  Breastfeeding makes your uterus contract to its prepregnancy size faster and slows bleeding (lochia) after you give birth.  Breastfeeding helps to lower your risk of developing type 2 diabetes mellitus, osteoporosis, and breast or ovarian cancer later in life.  Signs that your baby is hungry Early Signs of Hunger  Increased alertness or activity.  Stretching.  Movement of the head from side to  side.  Movement of the head and opening of the mouth when the corner of the mouth or cheek is stroked (rooting).  Increased sucking sounds, smacking lips, cooing, sighing, or squeaking.  Hand-to-mouth movements.  Increased sucking of fingers or hands.  Late Signs of Hunger  Fussing.  Intermittent crying.  Extreme Signs of Hunger Signs of extreme hunger will require calming and consoling before your baby will be able to breastfeed successfully. Do not wait for the following signs of extreme hunger to occur before you initiate breastfeeding:  Restlessness.  A loud, strong cry.  Screaming.  Breastfeeding basics Breastfeeding Initiation  Find a comfortable place to sit or lie down, with your neck and back well supported.  Place a pillow or rolled up blanket under your baby to bring him or her to the level of your breast (if you are seated). Nursing pillows are specially designed to help support your arms and your baby while you breastfeed.  Make sure that your baby's abdomen is facing your abdomen.  Gently massage your breast. With your fingertips, massage from your chest wall toward your nipple in a circular motion. This encourages milk flow. You may need to continue this action during the feeding if your milk flows slowly.  Support your breast with 4 fingers underneath and your thumb above your nipple. Make sure your fingers are well away from your nipple and your baby's mouth.  Stroke your baby's lips gently with your finger or nipple.  When your baby's mouth is open wide enough, quickly bring your baby to your breast, placing your entire nipple and as much of the colored area   areola) as possible into your baby's mouth.  More areola should be visible above your baby's upper lip than below the lower lip.  Your baby's tongue should be between his or her lower gum and your breast.  Ensure that your baby's mouth is correctly positioned around your nipple (latched). Your baby's  lips should create a seal on your breast and be turned out (everted).  It is common for your baby to suck about 2-3 minutes in order to start the flow of breast milk. Latching  Teaching your baby how to latch on to your breast properly is very important. An improper latch can cause nipple pain and decreased milk supply for you and poor weight gain in your baby. Also, if your baby is not latched onto your nipple properly, he or she may swallow some air during feeding. This can make your baby fussy. Burping your baby when you switch breasts during the feeding can help to get rid of the air. However, teaching your baby to latch on properly is still the best way to prevent fussiness from swallowing air while breastfeeding. Signs that your baby has successfully latched on to your nipple:  Silent tugging or silent sucking, without causing you pain.  Swallowing heard between every 3-4 sucks.  Muscle movement above and in front of his or her ears while sucking. Signs that your baby has not successfully latched on to nipple:  Sucking sounds or smacking sounds from your baby while breastfeeding.  Nipple pain. If you think your baby has not latched on correctly, slip your finger into the corner of your baby's mouth to break the suction and place it between your baby's gums. Attempt breastfeeding initiation again. Signs of Successful Breastfeeding  Signs from your baby:  A gradual decrease in the number of sucks or complete cessation of sucking.  Falling asleep.  Relaxation of his or her body.  Retention of a small amount of milk in his or her mouth.  Letting go of your breast by himself or herself. Signs from you:  Breasts that have increased in firmness, weight, and size 1-3 hours after feeding.  Breasts that are softer immediately after breastfeeding.  Increased milk volume, as well as a change in milk consistency and color by the fifth day of breastfeeding.  Nipples that are not sore,  cracked, or bleeding. Signs That Your Baby is Getting Enough Milk  Wetting at least 1-2 diapers during the first 24 hours after birth.  Wetting at least 5-6 diapers every 24 hours for the first week after birth. The urine should be clear or pale yellow by 5 days after birth.  Wetting 6-8 diapers every 24 hours as your baby continues to grow and develop.  At least 3 stools in a 24-hour period by age 5 days. The stool should be soft and yellow.  At least 3 stools in a 24-hour period by age 7 days. The stool should be seedy and yellow.  No loss of weight greater than 10% of birth weight during the first 3 days of age.  Average weight gain of 4-7 ounces (113-198 g) per week after age 4 days.  Consistent daily weight gain by age 5 days, without weight loss after the age of 2 weeks. After a feeding, your baby may spit up a small amount. This is common. Breastfeeding frequency and duration Frequent feeding will help you make more milk and can prevent sore nipples and breast engorgement. Breastfeed when you feel the need to reduce the   you feel the need to reduce the fullness of your breasts or when your baby shows signs of hunger. This is called "breastfeeding on demand." Avoid introducing a pacifier to your baby while you are working to establish breastfeeding (the first 4-6 weeks after your baby is born). After this time you may choose to use a pacifier. Research has shown that pacifier use during the first year of a baby's life decreases the risk of sudden infant death syndrome (SIDS). Allow your baby to feed on each breast as long as he or she wants. Breastfeed until your baby is finished feeding. When your baby unlatches or falls asleep while feeding from the first breast, offer the second breast. Because newborns are often sleepy in the first few weeks of life, you may need to awaken your baby to get him or her to feed. Breastfeeding times will vary from baby to baby. However,  the following rules can serve as a guide to help you ensure that your baby is properly fed:  Newborns (babies 4 weeks of age or younger) may breastfeed every 1-3 hours.  Newborns should not go longer than 3 hours during the day or 5 hours during the night without breastfeeding.  You should breastfeed your baby a minimum of 8 times in a 24-hour period until you begin to introduce solid foods to your baby at around 6 months of age.  Breast milk pumping Pumping and storing breast milk allows you to ensure that your baby is exclusively fed your breast milk, even at times when you are unable to breastfeed. This is especially important if you are going back to work while you are still breastfeeding or when you are not able to be present during feedings. Your lactation consultant can give you guidelines on how long it is safe to store breast milk. A breast pump is a machine that allows you to pump milk from your breast into a sterile bottle. The pumped breast milk can then be stored in a refrigerator or freezer. Some breast pumps are operated by hand, while others use electricity. Ask your lactation consultant which type will work best for you. Breast pumps can be purchased, but some hospitals and breastfeeding support groups lease breast pumps on a monthly basis. A lactation consultant can teach you how to hand express breast milk, if you prefer not to use a pump. Caring for your breasts while you breastfeed Nipples can become dry, cracked, and sore while breastfeeding. The following recommendations can help keep your breasts moisturized and healthy:  Avoid using soap on your nipples.  Wear a supportive bra. Although not required, special nursing bras and tank tops are designed to allow access to your breasts for breastfeeding without taking off your entire bra or top. Avoid wearing underwire-style bras or extremely tight bras.  Air dry your nipples for 3-4minutes after each feeding.  Use only cotton  bra pads to absorb leaked breast milk. Leaking of breast milk between feedings is normal.  Use lanolin on your nipples after breastfeeding. Lanolin helps to maintain your skin's normal moisture barrier. If you use pure lanolin, you do not need to wash it off before feeding your baby again. Pure lanolin is not toxic to your baby. You may also hand express a few drops of breast milk and gently massage that milk into your nipples and allow the milk to air dry.  In the first few weeks after giving birth, some women experience extremely full breasts (engorgement). Engorgement can make your   Engorgement peaks within 3-5 days after you give birth. The following recommendations can help ease engorgement:  Completely empty your breasts while breastfeeding or pumping. You may want to start by applying warm, moist heat (in the shower or with warm water-soaked hand towels) just before feeding or pumping. This increases circulation and helps the milk flow. If your baby does not completely empty your breasts while breastfeeding, pump any extra milk after he or she is finished.  Wear a snug bra (nursing or regular) or tank top for 1-2 days to signal your body to slightly decrease milk production.  Apply ice packs to your breasts, unless this is too uncomfortable for you.  Make sure that your baby is latched on and positioned properly while breastfeeding. If engorgement persists after 48 hours of following these recommendations, contact your health care provider or a Advertising copywriterlactation consultant. Overall health care recommendations while breastfeeding  Eat healthy foods. Alternate between meals and snacks, eating 3 of each per day. Because what you eat affects your breast milk, some of the foods may make your baby more irritable than usual. Avoid eating these foods if you are sure that they are negatively affecting your baby.  Drink milk, fruit juice, and water to satisfy your thirst (about 10 glasses a day).  Rest often,  relax, and continue to take your prenatal vitamins to prevent fatigue, stress, and anemia.  Continue breast self-awareness checks.  Avoid chewing and smoking tobacco. Chemicals from cigarettes that pass into breast milk and exposure to secondhand smoke may harm your baby.  Avoid alcohol and drug use, including marijuana. Some medicines that may be harmful to your baby can pass through breast milk. It is important to ask your health care provider before taking any medicine, including all over-the-counter and prescription medicine as well as vitamin and herbal supplements. It is possible to become pregnant while breastfeeding. If birth control is desired, ask your health care provider about options that will be safe for your baby. Contact a health care provider if:  You feel like you want to stop breastfeeding or have become frustrated with breastfeeding.  You have painful breasts or nipples.  Your nipples are cracked or bleeding.  Your breasts are red, tender, or warm.  You have a swollen area on either breast.  You have a fever or chills.  You have nausea or vomiting.  You have drainage other than breast milk from your nipples.  Your breasts do not become full before feedings by the fifth day after you give birth.  You feel sad and depressed.  Your baby is too sleepy to eat well.  Your baby is having trouble sleeping.  Your baby is wetting less than 3 diapers in a 24-hour period.  Your baby has less than 3 stools in a 24-hour period.  Your baby's skin or the white part of his or her eyes becomes yellow.  Your baby is not gaining weight by 25 days of age. Get help right away if:  Your baby is overly tired (lethargic) and does not want to wake up and feed.  Your baby develops an unexplained fever. This information is not intended to replace advice given to you by your health care provider. Make sure you discuss any questions you have with your health care  provider. Document Released: 12/11/2005 Document Revised: 05/24/2016 Document Reviewed: 06/04/2013 Elsevier Interactive Patient Education  2017 [employer] Second Trimester of Pregnancy The second trimester is from week 13 through week 28 (months 4 through  6). The second trimester is often a time when you feel your best. Your body has also adjusted to being pregnant, and you begin to feel better physically. Usually, morning sickness has lessened or quit completely, you may have more energy, and you may have an increase in appetite. The second trimester is also a time when the fetus is growing rapidly. At the end of the sixth month, the fetus is about 9 inches long and weighs about 1 pounds. You will likely begin to feel the baby move (quickening) between 18 and 20 weeks of the pregnancy. Body changes during your second trimester Your body continues to go through many changes during your second trimester. The changes vary from woman to woman.  Your weight will continue to increase. You will notice your lower abdomen bulging out.  You may begin to get stretch marks on your hips, abdomen, and breasts.  You may develop headaches that can be relieved by medicines. The medicines should be approved by your health care provider.  You may urinate more often because the fetus is pressing on your bladder.  You may develop or continue to have heartburn as a result of your pregnancy.  You may develop constipation because certain hormones are causing the muscles that push waste through your intestines to slow down.  You may develop hemorrhoids or swollen, bulging veins (varicose veins).  You may have back pain. This is caused by:  Weight gain.  Pregnancy hormones that are relaxing the joints in your pelvis.  A shift in weight and the muscles that support your balance.  Your breasts will continue to grow and they will continue to become tender.  Your gums may bleed and may be sensitive to  brushing and flossing.  Dark spots or blotches (chloasma, mask of pregnancy) may develop on your face. This will likely fade after the baby is born.  A dark line from your belly button to the pubic area (linea nigra) may appear. This will likely fade after the baby is born.  You may have changes in your hair. These can include thickening of your hair, rapid growth, and changes in texture. Some women also have hair loss during or after pregnancy, or hair that feels dry or thin. Your hair will most likely return to normal after your baby is born. What to expect at prenatal visits During a routine prenatal visit:  You will be weighed to make sure you and the fetus are growing normally.  Your blood pressure will be taken.  Your abdomen will be measured to track your baby's growth.  The fetal heartbeat will be listened to.  Any test results from the previous visit will be discussed. Your health care provider may ask you:  How you are feeling.  If you are feeling the baby move.  If you have had any abnormal symptoms, such as leaking fluid, bleeding, severe headaches, or abdominal cramping.  If you are using any tobacco products, including cigarettes, chewing tobacco, and electronic cigarettes.  If you have any questions. Other tests that may be performed during your second trimester include:  Blood tests that check for:  Low iron levels (anemia).  Gestational diabetes (between 24 and 28 weeks).  Rh antibodies. This is to check for a protein on red blood cells (Rh factor).  Urine tests to check for infections, diabetes, or protein in the urine.  An ultrasound to confirm the proper growth and development of the baby.  An amniocentesis to check for possible genetic  problems.  Fetal screens for spina bifida and Down syndrome.  HIV (human immunodeficiency virus) testing. Routine prenatal testing includes screening for HIV, unless you choose not to have this test. Follow these  instructions at home: Eating and drinking  Continue to eat regular, healthy meals.  Avoid raw meat, uncooked cheese, cat litter boxes, and soil used by cats. These carry germs that can cause birth defects in the baby.  Take your prenatal vitamins.  Take 1500-2000 mg of calcium daily starting at the 20th week of pregnancy until you deliver your baby.  If you develop constipation:  Take over-the-counter or prescription medicines.  Drink enough fluid to keep your urine clear or pale yellow.  Eat foods that are high in fiber, such as fresh fruits and vegetables, whole grains, and beans.  Limit foods that are high in fat and processed sugars, such as fried and sweet foods. Activity  Exercise only as directed by your health care provider. Experiencing uterine cramps is a good sign to stop exercising.  Avoid heavy lifting, wear low heel shoes, and practice good posture.  Wear your seat belt at all times when driving.  Rest with your legs elevated if you have leg cramps or low back pain.  Wear a good support bra for breast tenderness.  Do not use hot tubs, steam rooms, or saunas. Lifestyle  Avoid all smoking, herbs, alcohol, and unprescribed drugs. These chemicals affect the formation and growth of the baby.  Do not use any products that contain nicotine or tobacco, such as cigarettes and e-cigarettes. If you need help quitting, ask your health care provider.  A sexual relationship may be continued unless your health care provider directs you otherwise. General instructions  Follow your health care provider's instructions regarding medicine use. There are medicines that are either safe or unsafe to take during pregnancy.  Take warm sitz baths to soothe any pain or discomfort caused by hemorrhoids. Use hemorrhoid cream if your health care provider approves.  If you develop varicose veins, wear support hose. Elevate your feet for 15 minutes, 3-4 times a day. Limit salt in your  diet.  Visit your dentist if you have not gone yet during your pregnancy. Use a soft toothbrush to brush your teeth and be gentle when you floss.  Keep all follow-up prenatal visits as told by your health care provider. This is important. Contact a health care provider if:  You have dizziness.  You have mild pelvic cramps, pelvic pressure, or nagging pain in the abdominal area.  You have persistent nausea, vomiting, or diarrhea.  You have a bad smelling vaginal discharge.  You have pain with urination. Get help right away if:  You have a fever.  You are leaking fluid from your vagina.  You have spotting or bleeding from your vagina.  You have severe abdominal cramping or pain.  You have rapid weight gain or weight loss.  You have shortness of breath with chest pain.  You notice sudden or extreme swelling of your face, hands, ankles, feet, or legs.  You have not felt your baby move in over an hour.  You have severe headaches that do not go away with medicine.  You have vision changes. Summary  The second trimester is from week 13 through week 28 (months 4 through 6). It is also a time when the fetus is growing rapidly.  Your body goes through many changes during pregnancy. The changes vary from woman to woman.  Avoid all smoking, herbs,  alcohol, and unprescribed drugs. These chemicals affect the formation and growth your baby.  Do not use any tobacco products, such as cigarettes, chewing tobacco, and e-cigarettes. If you need help quitting, ask your health care provider.  Contact your health care provider if you have any questions. Keep all prenatal visits as told by your health care provider. This is important. This information is not intended to replace advice given to you by your health care provider. Make sure you discuss any questions you have with your health care provider. Document Released: 12/05/2001 Document Revised: 05/18/2016 Document Reviewed:  02/11/2013 Elsevier Interactive Patient Education  2017 [employer]

## 2017-01-22 NOTE — Progress Notes (Signed)
   PRENATAL VISIT NOTE  Subjective:  Angelica Bowers is a 21 y.o. G1P0 at 1119w6d being seen today for ongoing prenatal care.  She is currently monitored for the following issues for this low-risk pregnancy and has Supervision of normal first pregnancy, antepartum on her problem list.  Patient reports no complaints.   . Vag. Bleeding: None.  Movement: Present. Denies leaking of fluid.   The following portions of the patient's history were reviewed and updated as appropriate: allergies, current medications, past family history, past medical history, past social history, past surgical history and problem list. Problem list updated.  Objective:   Vitals:   01/22/17 0957  BP: 117/65  Pulse: (!) 103  Weight: 222 lb (100.7 kg)    Fetal Status: Fetal Heart Rate (bpm): 140 Fundal Height: 27 cm Movement: Present     General:  Alert, oriented and cooperative. Patient is in no acute distress.  Skin: Skin is warm and dry. No rash noted.   Cardiovascular: Normal heart rate noted  Respiratory: Normal respiratory effort, no problems with respiration noted  Abdomen: Soft, gravid, appropriate for gestational age. Pain/Pressure: Absent     Pelvic:  Cervical exam deferred        Extremities: Normal range of motion.  Edema: Trace  Mental Status: Normal mood and affect. Normal behavior. Normal judgment and thought content.   Assessment and Plan:  Pregnancy: G1P0 at 3319w6d  1. Supervision of normal first pregnancy, antepartum Continue routine prenatal care. Discussed weight gain/exercise. Advised of water birth information She is sad about lack of support by FOB-->discussed mindfulness strategies.  Preterm labor symptoms and general obstetric precautions including but not limited to vaginal bleeding, contractions, leaking of fluid and fetal movement were reviewed in detail with the patient. Please refer to After Visit Summary for other counseling recommendations.  Return in about 2 weeks (around  02/05/2017) for 28 wk labs, ob visit.   Reva Boresanya S Deane Melick, MD

## 2017-02-05 ENCOUNTER — Ambulatory Visit (INDEPENDENT_AMBULATORY_CARE_PROVIDER_SITE_OTHER): Payer: Self-pay | Admitting: Family Medicine

## 2017-02-05 VITALS — BP 120/72 | HR 100 | Wt 225.0 lb

## 2017-02-05 DIAGNOSIS — Z3493 Encounter for supervision of normal pregnancy, unspecified, third trimester: Secondary | ICD-10-CM

## 2017-02-05 DIAGNOSIS — Z3490 Encounter for supervision of normal pregnancy, unspecified, unspecified trimester: Secondary | ICD-10-CM

## 2017-02-05 MED ORDER — FAMOTIDINE 20 MG PO TABS: 20.0000 mg | ORAL_TABLET | Freq: Two times a day (BID) | ORAL | 3 refills | Status: DC

## 2017-02-05 NOTE — Progress Notes (Signed)
   PRENATAL VISIT NOTE  Subjective:  Angelica Bowers is a 21 y.o. G1P0 at 6512w4d being seen today for ongoing prenatal care.  She is currently monitored for the following issues for this low-risk pregnancy and has Supervision of normal first pregnancy, antepartum on her problem list.  Patient reports heartburn.  Contractions: Not present. Vag. Bleeding: None.  Movement: Present. Denies leaking of fluid.   The following portions of the patient's history were reviewed and updated as appropriate: allergies, current medications, past family history, past medical history, past social history, past surgical history and problem list. Problem list updated.  Objective:   Vitals:   02/05/17 0951  BP: 120/72  Pulse: 100  Weight: 225 lb (102.1 kg)    Fetal Status: Fetal Heart Rate (bpm): 130   Movement: Present     General:  Alert, oriented and cooperative. Patient is in no acute distress.  Skin: Skin is warm and dry. No rash noted.   Cardiovascular: Normal heart rate noted  Respiratory: Normal respiratory effort, no problems with respiration noted  Abdomen: Soft, gravid, appropriate for gestational age. Pain/Pressure: Absent     Pelvic:  Cervical exam deferred        Extremities: Normal range of motion.  Edema: None  Mental Status: Normal mood and affect. Normal behavior. Normal judgment and thought content.   Assessment and Plan:  Pregnancy: G1P0 at 8112w4d  1. Prenatal care, antepartum FHT and Fh normal. 28 week labs. Pepcid for heartburn - Glucose Tolerance, 2 Hours w/1 Hour - HIV antibody (with reflex) - CBC - RPR  Preterm labor symptoms and general obstetric precautions including but not limited to vaginal bleeding, contractions, leaking of fluid and fetal movement were reviewed in detail with the patient. Please refer to After Visit Summary for other counseling recommendations.  Return in about 2 weeks (around 02/19/2017).   Levie HeritageJacob J Stinson, DO

## 2017-02-06 LAB — CBC
Hematocrit: 34.5 % (ref 34.0–46.6)
Hemoglobin: 11.5 g/dL (ref 11.1–15.9)
MCH: 30.3 pg (ref 26.6–33.0)
MCHC: 33.3 g/dL (ref 31.5–35.7)
MCV: 91 fL (ref 79–97)
Platelets: 317 10*3/uL (ref 150–379)
RBC: 3.79 x10E6/uL (ref 3.77–5.28)
RDW: 13.9 % (ref 12.3–15.4)
WBC: 8.6 10*3/uL (ref 3.4–10.8)

## 2017-02-06 LAB — GLUCOSE TOLERANCE, 2 HOURS W/ 1HR
GLUCOSE, 2 HOUR: 94 mg/dL (ref 65–152)
GLUCOSE, FASTING: 78 mg/dL (ref 65–91)
Glucose, 1 hour: 97 mg/dL (ref 65–179)

## 2017-02-06 LAB — HIV ANTIBODY (ROUTINE TESTING W REFLEX): HIV SCREEN 4TH GENERATION: NONREACTIVE

## 2017-02-06 LAB — RPR: RPR: NONREACTIVE

## 2017-02-22 ENCOUNTER — Ambulatory Visit (INDEPENDENT_AMBULATORY_CARE_PROVIDER_SITE_OTHER): Payer: Self-pay | Admitting: Family Medicine

## 2017-02-22 ENCOUNTER — Encounter: Payer: Self-pay | Admitting: Family Medicine

## 2017-02-22 VITALS — BP 115/66 | HR 79 | Wt 231.0 lb

## 2017-02-22 DIAGNOSIS — Z3400 Encounter for supervision of normal first pregnancy, unspecified trimester: Secondary | ICD-10-CM

## 2017-02-22 DIAGNOSIS — O26843 Uterine size-date discrepancy, third trimester: Secondary | ICD-10-CM | POA: Insufficient documentation

## 2017-02-22 NOTE — Progress Notes (Signed)
   PRENATAL VISIT NOTE  Subjective:  Angelica Bowers is a 21 y.o. G1P0 at 5470w0d being seen today for ongoing prenatal care.  She is currently monitored for the following issues for this low-risk pregnancy and has Supervision of normal first pregnancy, antepartum and Size of fetus inconsistent with dates in third trimester on her problem list.  Patient reports heartburn. Hasn't picked up medication yet. Contractions: Not present. Vag. Bleeding: None.  Movement: Present. Denies leaking of fluid.   The following portions of the patient's history were reviewed and updated as appropriate: allergies, current medications, past family history, past medical history, past social history, past surgical history and problem list. Problem list updated.  Objective:   Vitals:   02/22/17 0855  BP: 115/66  Pulse: 79  Weight: 231 lb (104.8 kg)    Fetal Status: Fetal Heart Rate (bpm): 147 Fundal Height: 37 cm Movement: Present     General:  Alert, oriented and cooperative. Patient is in no acute distress.  Skin: Skin is warm and dry. No rash noted.   Cardiovascular: Normal heart rate noted  Respiratory: Normal respiratory effort, no problems with respiration noted  Abdomen: Soft, gravid, appropriate for gestational age. Pain/Pressure: Present     Pelvic:  Cervical exam deferred        Extremities: Normal range of motion.  Edema: None  Mental Status: Normal mood and affect. Normal behavior. Normal judgment and thought content.   Assessment and Plan:  Pregnancy: G1P0 at 8570w0d  1. Supervision of normal first pregnancy, antepartum FHT normal. FH ahead. Will get US - US MFM OB FOLLOW UP; Future  2. Size of fetus inconsistent with dates in third trimester  - US MFM OB FOLLOW UP; Future  Preterm labor symptoms and general obstetric precautions including but not limited to vaginal bleeding, contractions, leaking of fluid and fetal movement were reviewed in detail with the patient. Please refer to  After Visit Summary for other counseling recommendations.  Return in about 2 weeks (around 03/08/2017) for OB f/u.   Levie HeritageJacob J Tobin Witucki, DO

## 2017-02-26 ENCOUNTER — Other Ambulatory Visit: Payer: Self-pay | Admitting: Family Medicine

## 2017-02-26 ENCOUNTER — Ambulatory Visit (HOSPITAL_COMMUNITY)
Admission: RE | Admit: 2017-02-26 | Discharge: 2017-02-26 | Disposition: A | Discharge status: Home / Self Care | Payer: Medicaid Other | Source: Ambulatory Visit | Attending: Family Medicine | Admitting: Family Medicine

## 2017-02-26 DIAGNOSIS — Z3A33 33 weeks gestation of pregnancy: Secondary | ICD-10-CM

## 2017-02-26 DIAGNOSIS — O26843 Uterine size-date discrepancy, third trimester: Secondary | ICD-10-CM

## 2017-02-26 DIAGNOSIS — O403XX0 Polyhydramnios, third trimester, not applicable or unspecified: Secondary | ICD-10-CM

## 2017-02-26 DIAGNOSIS — Z3400 Encounter for supervision of normal first pregnancy, unspecified trimester: Secondary | ICD-10-CM

## 2017-02-27 ENCOUNTER — Other Ambulatory Visit (HOSPITAL_COMMUNITY): Payer: Self-pay | Admitting: *Deleted

## 2017-02-27 DIAGNOSIS — O403XX0 Polyhydramnios, third trimester, not applicable or unspecified: Secondary | ICD-10-CM

## 2017-03-02 ENCOUNTER — Encounter (HOSPITAL_COMMUNITY): Payer: Self-pay | Admitting: Family Medicine

## 2017-03-05 ENCOUNTER — Encounter: Payer: Self-pay | Admitting: Family Medicine

## 2017-03-05 DIAGNOSIS — O403XX0 Polyhydramnios, third trimester, not applicable or unspecified: Secondary | ICD-10-CM | POA: Insufficient documentation

## 2017-03-08 ENCOUNTER — Ambulatory Visit (HOSPITAL_COMMUNITY)
Admission: RE | Admit: 2017-03-08 | Discharge: 2017-03-08 | Disposition: A | Discharge status: Home / Self Care | Payer: Medicaid Other | Source: Ambulatory Visit | Attending: Family Medicine | Admitting: Family Medicine

## 2017-03-08 ENCOUNTER — Encounter (HOSPITAL_COMMUNITY): Payer: Self-pay

## 2017-03-08 DIAGNOSIS — O403XX0 Polyhydramnios, third trimester, not applicable or unspecified: Secondary | ICD-10-CM | POA: Insufficient documentation

## 2017-03-08 DIAGNOSIS — Z3A35 35 weeks gestation of pregnancy: Secondary | ICD-10-CM | POA: Diagnosis not present

## 2017-03-08 NOTE — ED Notes (Signed)
Pt tearful today, when asked if she would like to talk to someone she stated "there's nothing that anyone can do."  Explained to pt that we have a chaplain or social worker available, she declined.  Pt feels safe and denies any feelings of harming herself.

## 2017-03-09 ENCOUNTER — Ambulatory Visit (INDEPENDENT_AMBULATORY_CARE_PROVIDER_SITE_OTHER): Payer: Medicaid Other | Admitting: Family Medicine

## 2017-03-09 VITALS — BP 133/66 | HR 83 | Wt 231.0 lb

## 2017-03-09 DIAGNOSIS — O403XX0 Polyhydramnios, third trimester, not applicable or unspecified: Secondary | ICD-10-CM

## 2017-03-09 DIAGNOSIS — O26843 Uterine size-date discrepancy, third trimester: Secondary | ICD-10-CM

## 2017-03-09 DIAGNOSIS — Z3400 Encounter for supervision of normal first pregnancy, unspecified trimester: Secondary | ICD-10-CM

## 2017-03-09 NOTE — Progress Notes (Signed)
   PRENATAL VISIT NOTE  Subjective:  Angelica Bowers is a 21 y.o. G1P0 at 2165w1d being seen today for ongoing prenatal care.  She is currently monitored for the following issues for this high-risk pregnancy and has Supervision of normal first pregnancy, antepartum; Size of fetus inconsistent with dates in third trimester; and Polyhydramnios in singleton pregnancy in third trimester on her problem list.  Patient reports no complaints. Pt emotional as FOB just left her. Contractions: Not present. Vag. Bleeding: None.  Movement: Present. Denies leaking of fluid.   The following portions of the patient's history were reviewed and updated as appropriate: allergies, current medications, past family history, past medical history, past social history, past surgical history and problem list. Problem list updated.  Objective:   Vitals:   03/09/17 0824  BP: 133/66  Pulse: 83  Weight: 231 lb (104.8 kg)    Fetal Status:     Movement: Present     General:  Alert, oriented and cooperative. Patient is in no acute distress.  Skin: Skin is warm and dry. No rash noted.   Cardiovascular: Normal heart rate noted  Respiratory: Normal respiratory effort, no problems with respiration noted  Abdomen: Soft, gravid, appropriate for gestational age. Pain/Pressure: Present     Pelvic:  Cervical exam deferred        Extremities: Normal range of motion.  Edema: Trace  Mental Status: Normal mood and affect. Normal behavior. Normal judgment and thought content.   Assessment and Plan:  Pregnancy: G1P0 at 1165w1d  1. Supervision of normal first pregnancy, antepartum EFW by US normal. FHT normal  2. Polyhydramnios in singleton pregnancy in third trimester Weekly BPP Delivery at 39 wks  Preterm labor symptoms and general obstetric precautions including but not limited to vaginal bleeding, contractions, leaking of fluid and fetal movement were reviewed in detail with the patient. Please refer to After Visit  Summary for other counseling recommendations.  No Follow-up on file.   Levie HeritageJacob J Stinson, DO

## 2017-03-15 ENCOUNTER — Encounter (HOSPITAL_COMMUNITY): Payer: Self-pay

## 2017-03-15 ENCOUNTER — Ambulatory Visit (HOSPITAL_COMMUNITY)
Admission: RE | Admit: 2017-03-15 | Discharge: 2017-03-15 | Disposition: A | Discharge status: Home / Self Care | Payer: Medicaid Other | Source: Ambulatory Visit | Attending: Family Medicine | Admitting: Family Medicine

## 2017-03-15 DIAGNOSIS — O99213 Obesity complicating pregnancy, third trimester: Secondary | ICD-10-CM | POA: Diagnosis not present

## 2017-03-15 DIAGNOSIS — O403XX0 Polyhydramnios, third trimester, not applicable or unspecified: Secondary | ICD-10-CM | POA: Diagnosis not present

## 2017-03-15 DIAGNOSIS — Z3A36 36 weeks gestation of pregnancy: Secondary | ICD-10-CM | POA: Insufficient documentation

## 2017-03-15 LAB — OB RESULTS CONSOLE GBS: STREP GROUP B AG: POSITIVE

## 2017-03-16 ENCOUNTER — Other Ambulatory Visit (HOSPITAL_COMMUNITY)
Admission: RE | Admit: 2017-03-16 | Discharge: 2017-03-16 | Disposition: A | Discharge status: Home / Self Care | Payer: Medicaid Other | Source: Ambulatory Visit | Attending: Family Medicine | Admitting: Family Medicine

## 2017-03-16 ENCOUNTER — Ambulatory Visit (INDEPENDENT_AMBULATORY_CARE_PROVIDER_SITE_OTHER): Payer: Medicaid Other | Admitting: Family Medicine

## 2017-03-16 VITALS — BP 132/80 | HR 82 | Wt 240.0 lb

## 2017-03-16 DIAGNOSIS — Z3403 Encounter for supervision of normal first pregnancy, third trimester: Secondary | ICD-10-CM | POA: Insufficient documentation

## 2017-03-16 DIAGNOSIS — Z3400 Encounter for supervision of normal first pregnancy, unspecified trimester: Secondary | ICD-10-CM

## 2017-03-16 DIAGNOSIS — O403XX0 Polyhydramnios, third trimester, not applicable or unspecified: Secondary | ICD-10-CM

## 2017-03-16 DIAGNOSIS — Z3A36 36 weeks gestation of pregnancy: Secondary | ICD-10-CM | POA: Diagnosis not present

## 2017-03-16 NOTE — Progress Notes (Signed)
   PRENATAL VISIT NOTE  Subjective:  Angelica Bowers is a 21 y.o. G1P0 at 7379w1d being seen today for ongoing prenatal care.  She is currently monitored for the following issues for this high-risk pregnancy and has Supervision of normal first pregnancy, antepartum; Size of fetus inconsistent with dates in third trimester; and Polyhydramnios in singleton pregnancy in third trimester on her problem list.  Patient reports no complaints.  Contractions: Not present. Vag. Bleeding: None.  Movement: Present. Denies leaking of fluid.   The following portions of the patient's history were reviewed and updated as appropriate: allergies, current medications, past family history, past medical history, past social history, past surgical history and problem list. Problem list updated.  Objective:   Vitals:   03/16/17 0923  BP: 132/80  Pulse: 82  Weight: 240 lb (108.9 kg)    Fetal Status: Fetal Heart Rate (bpm): 150   Movement: Present     General:  Alert, oriented and cooperative. Patient is in no acute distress.  Skin: Skin is warm and dry. No rash noted.   Cardiovascular: Normal heart rate noted  Respiratory: Normal respiratory effort, no problems with respiration noted  Abdomen: Soft, gravid, appropriate for gestational age. Pain/Pressure: Present     Pelvic:  Cervical exam deferred        Extremities: Normal range of motion.  Edema: Trace  Mental Status: Normal mood and affect. Normal behavior. Normal judgment and thought content.   Assessment and Plan:  Pregnancy: G1P0 at 2979w1d  1. Supervision of normal first pregnancy, antepartum FHT normal.  - Culture, beta strep (group b only) - GC/Chlamydia probe amp ([place]  2. Polyhydramnios in singleton pregnancy in third trimester BPP 8/8 yesterday. Induce at 39 weeks.  Preterm labor symptoms and general obstetric precautions including but not limited to vaginal bleeding, contractions, leaking of fluid and fetal movement  were reviewed in detail with the patient. Please refer to After Visit Summary for other counseling recommendations.  No Follow-up on file.   Levie HeritageJacob J Dallan Schonberg, DO

## 2017-03-19 LAB — GC/CHLAMYDIA PROBE AMP (~~LOC~~) NOT AT ARMC
CHLAMYDIA, DNA PROBE: NEGATIVE
NEISSERIA GONORRHEA: NEGATIVE

## 2017-03-19 LAB — CULTURE, BETA STREP (GROUP B ONLY): Strep Gp B Culture: POSITIVE — AB

## 2017-03-21 ENCOUNTER — Ambulatory Visit (INDEPENDENT_AMBULATORY_CARE_PROVIDER_SITE_OTHER): Payer: Medicaid Other | Admitting: Obstetrics & Gynecology

## 2017-03-21 VITALS — BP 122/71 | HR 80 | Wt 244.0 lb

## 2017-03-21 DIAGNOSIS — Z3481 Encounter for supervision of other normal pregnancy, first trimester: Secondary | ICD-10-CM

## 2017-03-21 DIAGNOSIS — O0993 Supervision of high risk pregnancy, unspecified, third trimester: Secondary | ICD-10-CM

## 2017-03-21 DIAGNOSIS — O403XX0 Polyhydramnios, third trimester, not applicable or unspecified: Secondary | ICD-10-CM

## 2017-03-21 DIAGNOSIS — O99820 Streptococcus B carrier state complicating pregnancy: Secondary | ICD-10-CM

## 2017-03-21 NOTE — Progress Notes (Signed)
   PRENATAL VISIT NOTE  Subjective:  Angelica Bowers is a 21 y.o. G1P0 at 8280w6d being seen today for ongoing prenatal care.  She is currently monitored for the following issues for this high-risk pregnancy and has Supervision of high risk pregnancy in third trimester; Polyhydramnios in singleton pregnancy in third trimester; and Group B Streptococcus carrier, +RV culture, currently pregnant on her problem list.  Patient reports no complaints.  Contractions: Not present. Vag. Bleeding: None.  Movement: Present. Denies leaking of fluid.   The following portions of the patient's history were reviewed and updated as appropriate: allergies, current medications, past family history, past medical history, past social history, past surgical history and problem list. Problem list updated.  Objective:   Vitals:   03/21/17 1308  BP: 122/71  Pulse: 80  Weight: 244 lb (110.7 kg)    Fetal Status: Fetal Heart Rate (bpm): 142 Fundal Height: 41 cm Movement: Present     General:  Alert, oriented and cooperative. Patient is in no acute distress.  Skin: Skin is warm and dry. No rash noted.   Cardiovascular: Normal heart rate noted  Respiratory: Normal respiratory effort, no problems with respiration noted  Abdomen: Soft, gravid, appropriate for gestational age. Pain/Pressure: Present     Pelvic:  Cervical exam deferred        Extremities: Normal range of motion.  Edema: Trace  Mental Status: Normal mood and affect. Normal behavior. Normal judgment and thought content.   Assessment and Plan:  Pregnancy: G1P0 at 9480w6d  1. Polyhydramnios in singleton pregnancy in third trimester Continue weekly BPP. IOL scheduled on 04/05/16 at 0700.  2. Group B Streptococcus carrier, +RV culture, currently pregnant Will get treatment in labor.  Patient aware.  3. Supervision of high risk pregnancy in third trimester Labor symptoms and general obstetric precautions including but not limited to vaginal bleeding,  contractions, leaking of fluid and fetal movement were reviewed in detail with the patient. Please refer to After Visit Summary for other counseling recommendations.  Return in about 1 week (around 03/28/2017) for OB Visit.   Tereso NewcomerUgonna A Maxx Pham, MD

## 2017-03-21 NOTE — Patient Instructions (Addendum)
Return to clinic for any scheduled appointments or obstetric concerns, or go to [facility] for evaluation   Group B Streptococcus Infection During Pregnancy Group B Streptococcus (GBS) is a type of bacteria (Streptococcus agalactiae) that is often found in healthy people, commonly in the rectum, vagina, and intestines. In people who are healthy and not pregnant, the bacteria rarely cause serious illness or complications. However, women who test positive for GBS during pregnancy can pass the bacteria to their baby during childbirth, which can cause serious infection in the baby after birth. Women with GBS may also have infections during their pregnancy or immediately after childbirth, such as such as urinary tract infections (UTIs) or infections of the uterus (uterine infections). Having GBS also increases a woman's risk of complications during pregnancy, such as early (preterm) labor or delivery, miscarriage, or stillbirth. Routine testing (screening) for GBS is recommended for all pregnant women. What increases the risk? You may have a higher risk for GBS infection during pregnancy if you had one during a past pregnancy. What are the signs or symptoms? In most cases, GBS infection does not cause symptoms in pregnant women. Signs and symptoms of a possible GBS-related infection may include:  Labor starting before the 37th week of pregnancy.  A UTI or bladder infection, which may cause:  Fever.  Pain or burning during urination.  Frequent urination.  Fever during labor, along with:  Bad-smelling discharge.  Uterine tenderness.  Rapid heartbeat in the mother, baby, or both. Rare but serious symptoms of a possible GBS-related infection in women include:  Blood infection (septicemia). This may cause fever, chills, or confusion.  Lung infection (pneumonia). This may cause fever, chills, cough, rapid breathing, difficulty breathing, or chest pain.  Bone, joint, skin, or soft tissue  infection. How is this diagnosed? You may be screened for GBS between week 35 and week 37 of your pregnancy. If you have symptoms of preterm labor, you may be screened earlier. This condition is diagnosed based on lab test results from:  A swab of fluid from the vagina and rectum.  A urine sample. How is this treated? This condition is treated with antibiotic medicine. When you go into labor, or as soon as your water breaks (your membranes rupture), you will be given antibiotics through an IV tube. Antibiotics will continue until after you give birth. If you are having a cesarean delivery, you do not need antibiotics unless your membranes have already ruptured. Follow these instructions at home:  Take over-the-counter and prescription medicines only as told by your health care provider.  Take your antibiotic medicine as told by your health care provider. Do not stop taking the antibiotic even if you start to feel better.  Keep all pre-birth (prenatal) visits and follow-up visits as told by your health care provider. This is important. Contact a health care provider if:  You have pain or burning when you urinate.  You have to urinate frequently.  You have a fever or chills.  You develop a bad-smelling vaginal discharge. Get help right away if:  Your membranes rupture.  You go into labor.  You have severe pain in your abdomen.  You have difficulty breathing.  You have chest pain. This information is not intended to replace advice given to you by your health care provider. Make sure you discuss any questions you have with your health care provider. Document Released: 03/19/2008 Document Revised: 07/07/2016 Document Reviewed: 07/06/2016 [employer] Interactive Patient Education  2017 [employer]   

## 2017-03-22 ENCOUNTER — Other Ambulatory Visit (HOSPITAL_COMMUNITY): Payer: Self-pay | Admitting: Maternal and Fetal Medicine

## 2017-03-22 ENCOUNTER — Ambulatory Visit (HOSPITAL_COMMUNITY)
Admission: RE | Admit: 2017-03-22 | Discharge: 2017-03-22 | Disposition: A | Discharge status: Home / Self Care | Payer: Medicaid Other | Source: Ambulatory Visit | Attending: Family Medicine | Admitting: Family Medicine

## 2017-03-22 ENCOUNTER — Encounter (HOSPITAL_COMMUNITY): Payer: Self-pay

## 2017-03-22 DIAGNOSIS — Z3A37 37 weeks gestation of pregnancy: Secondary | ICD-10-CM | POA: Diagnosis not present

## 2017-03-22 DIAGNOSIS — O99213 Obesity complicating pregnancy, third trimester: Secondary | ICD-10-CM | POA: Diagnosis not present

## 2017-03-22 DIAGNOSIS — O403XX0 Polyhydramnios, third trimester, not applicable or unspecified: Secondary | ICD-10-CM | POA: Diagnosis not present

## 2017-03-28 ENCOUNTER — Ambulatory Visit (INDEPENDENT_AMBULATORY_CARE_PROVIDER_SITE_OTHER): Payer: Medicaid Other | Admitting: Obstetrics & Gynecology

## 2017-03-28 VITALS — BP 117/71 | HR 80 | Wt 248.0 lb

## 2017-03-28 DIAGNOSIS — O403XX0 Polyhydramnios, third trimester, not applicable or unspecified: Secondary | ICD-10-CM

## 2017-03-28 DIAGNOSIS — O0993 Supervision of high risk pregnancy, unspecified, third trimester: Secondary | ICD-10-CM

## 2017-03-28 NOTE — Progress Notes (Signed)
PRENATAL VISIT NOTE  Subjective:  Angelica Bowers is a 21 y.o. G1P0 at [redacted]w[redacted]d being seen today for ongoing prenatal care.  She is currently monitored for the following issues for this high-risk pregnancy and has Supervision of high risk pregnancy in third trimester; Polyhydramnios in singleton pregnancy in third trimester; and Group B Streptococcus carrier, +RV culture, currently pregnant on her problem list.  Patient reports no complaints.  Contractions: Not present. Vag. Bleeding: None.  Movement: Present. Denies leaking of fluid.   The following portions of the patient's history were reviewed and updated as appropriate: allergies, current medications, past family history, past medical history, past social history, past surgical history and problem list. Problem list updated.  Objective:   Vitals:   03/28/17 1254  BP: 117/71  Pulse: 80  Weight: 248 lb (112.5 kg)    Fetal Status: Fetal Heart Rate (bpm): 147 Fundal Height: 41 cm Movement: Present     General:  Alert, oriented and cooperative. Patient is in no acute distress.  Skin: Skin is warm and dry. No rash noted.   Cardiovascular: Normal heart rate noted  Respiratory: Normal respiratory effort, no problems with respiration noted  Abdomen: Soft, gravid, appropriate for gestational age. Pain/Pressure: Present     Pelvic:  Cervical exam deferred        Extremities: Normal range of motion.  Edema: Trace  Mental Status: Normal mood and affect. Normal behavior. Normal judgment and thought content.    [place] Mfm Fetal Bpp Wo Non Stress  Result Date: 03/22/2017 ----------------------------------------------------------------------  OBSTETRICS REPORT                      (Signed Final 03/22/2017 10:09 am) ---------------------------------------------------------------------- Patient Info  ID #:       [id number]                         D.O.B.:   [birth year] (20 yrs)  Name:       Angelica Bowers [facility]            Visit Date:  03/22/2017 09:55  am ---------------------------------------------------------------------- Performed By  Performed By:     Vivien Rota        Secondary Phy.:   [facility]                    RDMS                                                             for Women's                                                             Healthcare  Attending:        Charlsie Merles MD         Address:          [address]  [address]  Referred By:      Levie Heritage        Location:         [facility]                    MD  Ref. Address:     [address]                    [place]                    [id number] ---------------------------------------------------------------------- Orders   #  Description                                 Code   1  [place] MFM FETAL BPP WO NON STRESS              [phone]  ----------------------------------------------------------------------   #  Ordered By               Order #        Accession #    Episode #   1  Particia Nearing            [id number]      [id number]     [id number]  ---------------------------------------------------------------------- Indications   [redacted] weeks gestation of pregnancy                Z3A.37   Polyhydramnios, third trimester, antepartum    O40.3XX0   condition or complication, unspecified fetus   Obesity complicating pregnancy, third          O99.213   trimester  ---------------------------------------------------------------------- OB History  Blood Type:            Height:  5'4"   Weight (lb):  199      BMI:   34.15  Gravidity:    1 ---------------------------------------------------------------------- Fetal Evaluation  Num Of Fetuses:     1  Fetal Heart         153  Rate(bpm):  Cardiac Activity:   Observed  Presentation:       Cephalic  Amniotic Fluid  AFI FV:      Subjectively upper-normal  AFI Sum(cm)     %Tile       Largest Pocket(cm)  24.41           95          10.28  RUQ(cm)        RLQ(cm)       LUQ(cm)        LLQ(cm)  6.21          10.28         2.67           5.25 ---------------------------------------------------------------------- Biophysical Evaluation  Amniotic F.V:   Pocket => 2 cm two         F. Tone:        Observed                  planes  F. Movement:    Observed                   Score:          8/8  F. Breathing:   Observed ---------------------------------------------------------------------- Gestational Age  LMP:           34w 2d       Date:   07/25/16                 EDD:   05/01/17  Best:          37w 0d    Det. By:   U/S  (10/24/16)          EDD:   04/12/17 ---------------------------------------------------------------------- Impression  SIUP at 37+0 weeks  Cephalic presentation  MIld polyhydramnios  BPP 8/8 ---------------------------------------------------------------------- Recommendations  Continue antenatal testing. growth scan on 03/30/2017 ----------------------------------------------------------------------                 Charlsie Merles, MD Electronically Signed Final Report   03/22/2017 10:09 am ----------------------------------------------------------------------  [place] Mfm Fetal Bpp Wo Non Stress  Result Date: 03/16/2017 ----------------------------------------------------------------------  OBSTETRICS REPORT                       (Signed Final 03/16/2017 12:37 am) ---------------------------------------------------------------------- Patient Info  ID #:       [id number]                          D.O.B.:  05-17-1996 (20 yrs)  Name:       Angelica Bowers [facility]             Visit Date: 03/15/2017 09:57 am ---------------------------------------------------------------------- Performed By  Performed By:     Westly Pam Phy.:    [facility]                    RDMS,RVT                                                              for Women's                                                              Healthcare  Attending:        Particia Nearing MD        Address:           [address]                                                              [address]  Referred By:      Levie Heritage        Location:          [facility]                    MD  Ref. Address:     [address]  [address]                    [place]                    [phone] ---------------------------------------------------------------------- Orders   #  Description                                 Code   1  [place] MFM FETAL BPP WO NON STRESS              69629.52  ----------------------------------------------------------------------   #  Ordered By               Order #        Accession #    Episode #   1  Particia Nearing            [id number]      [id number]     [id number]  ---------------------------------------------------------------------- Indications   [redacted] weeks gestation of pregnancy                Z3A.36   Polyhydramnios, third trimester, antepartum    O40.3XX0   condition or complication, unspecified fetus   Obesity complicating pregnancy, third          O99.213   trimester  ---------------------------------------------------------------------- OB History  Blood Type:            Height:  5'4"   Weight (lb):  199       BMI:  34.15  Gravidity:    1 ---------------------------------------------------------------------- Fetal Evaluation  Num Of Fetuses:     1  Fetal Heart         141  Rate(bpm):  Cardiac Activity:   Observed  Presentation:       Cephalic  Placenta:           Posterior, above cervical os  Amniotic Fluid  AFI FV:      Polyhydramnios  AFI Sum(cm)     %Tile       Largest Pocket(cm)  28.26           > 97        8.62  RUQ(cm)       RLQ(cm)       LUQ(cm)        LLQ(cm)  7.54          6.7           5.4            8.62 ---------------------------------------------------------------------- Biophysical Evaluation  Amniotic F.V:   Polyhydramnios             F. Tone:         Observed  F. Movement:    Observed                   Score:           8/8  F. Breathing:    Observed ---------------------------------------------------------------------- Gestational Age  LMP:           33w 2d        Date:  07/25/16                 EDD:    05/01/17  Best:          Stevie Kern 0d     Det. By:  U/S  (10/24/16)          EDD:  04/12/17 ---------------------------------------------------------------------- Impression  SIUP at 36+0 weeks  Cephalic presentation  MIld polyhydramnios  BPP 8/8 ---------------------------------------------------------------------- Recommendations  Continue antenatal testing ----------------------------------------------------------------------                 Particia Nearing, MD Electronically Signed Final Report   03/16/2017 12:37 am ----------------------------------------------------------------------  [place] Mfm Fetal Bpp Wo Non Stress  Result Date: 03/08/2017 ----------------------------------------------------------------------  OBSTETRICS REPORT                      (Signed Final 03/08/2017 11:10 am) ---------------------------------------------------------------------- Patient Info  ID #:       [id number]                         D.O.B.:   [birth year] (20 yrs)  Name:       [name]            Visit Date:  03/08/2017 10:40 am ---------------------------------------------------------------------- Performed By  Performed By:     Tommie Raymond BS,       Secondary Phy.:   [facility]                    RDMS, RVT                                                             for Women's                                                             Healthcare  Attending:        Rema Fendt MD      Address:          [address]                                                             [address]  Referred By:      Levie Heritage        Location:         [facility]                    MD  Ref. Address:     [address]                    [place]                    [phone]  ---------------------------------------------------------------------- Orders   #  Description                                 Code   1  [place] MFM FETAL BPP WO NON STRESS  [address].60  ----------------------------------------------------------------------   #  Ordered By               Order #        Accession #    Episode #   1  Particia Nearing            [id number]      [id number]     [id number]  ---------------------------------------------------------------------- Indications   [redacted] weeks gestation of pregnancy                Z3A.35   Polyhydramnios, third trimester, antepartum    O40.3XX0   condition or complication, unspecified fetus  ---------------------------------------------------------------------- OB History  Blood Type:            Height:  5'4"   Weight (lb):  199      BMI:   34.15  Gravidity:    1 ---------------------------------------------------------------------- Fetal Evaluation  Num Of Fetuses:     1  Fetal Heart         146  Rate(bpm):  Cardiac Activity:   Observed  Presentation:       Cephalic  Placenta:           Posterior, above cervical os  P. Cord Insertion:  Previously Visualized  Amniotic Fluid  AFI FV:      Polyhydramnios  AFI Sum(cm)     %Tile       Largest Pocket(cm)  31.09           > 97        10.68  RUQ(cm)       RLQ(cm)       LUQ(cm)        LLQ(cm)  10.68         3.8           7.93           8.7 ---------------------------------------------------------------------- Biophysical Evaluation  Amniotic F.V:   Polyhydramnios             F. Tone:        Observed  F. Movement:    Observed                   Score:          8/8  F. Breathing:   Observed ---------------------------------------------------------------------- Gestational Age  LMP:           32w 2d       Date:   07/25/16                 EDD:   05/01/17  Best:          Consuello Closs 0d    Det. By:   U/S  (10/24/16)          EDD:   04/12/17 ---------------------------------------------------------------------- Cervix Uterus Adnexa  Cervix   Not visualized (advanced GA >29wks)  Uterus  No abnormality visualized.  Left Ovary  Size(cm)     3.62  x    2.57   x  1.24      Vol(ml): 6  Within normal limits. No adnexal mass visualized.  Right Ovary  Size(cm)     3.47  x    1.19   x  2.27      Vol(ml): 4.9  Within normal limits. No adnexal mass visualized.  Cul De Sac:   No free fluid seen.  Adnexa:       No abnormality visualized. ---------------------------------------------------------------------- Impression  Single living intrauterine pregnancy at  35 weeks 0 days.  Normal amniotic fluid volume.  BPP 8/8. ---------------------------------------------------------------------- Recommendations  Continue weekly BPPs. ----------------------------------------------------------------------                 Rema Fendt, MD Electronically Signed Final Report   03/08/2017 11:10 am ----------------------------------------------------------------------  [place] Mfm Fetal Bpp Wo Non Stress  Result Date: 02/27/2017 ----------------------------------------------------------------------  OBSTETRICS REPORT                      (Signed Final 02/27/2017 08:38 am) ---------------------------------------------------------------------- Patient Info  ID #:       [id number]                         D.O.B.:   1996-05-26 (20 yrs)  Name:       MARNEY TRELOAR [facility]            Visit Date:  02/26/2017 02:42 pm ---------------------------------------------------------------------- Performed By  Performed By:     Eden Lathe BS      Ref. Address:     [address]                    RDMS RVT                                                             [address]                                                             [place]                                                             [phone]  Attending:        Particia Nearing MD       Location:         [facility]  Referred By:      Levie Heritage                    MD  ---------------------------------------------------------------------- Orders   #  Description                                 Code   1  [place] MFM OB FOLLOW UP                         [phone]   2  [place] MFM FETAL BPP WO NON STRESS              76819.01  ----------------------------------------------------------------------   #  Ordered By               Order #        Accession #    Episode #   1  JACOB STINSON  [id number]      [id number]     [id number]   2  MARTHA DECKER            [id number]      [id number]     [id number]  ---------------------------------------------------------------------- Indications   [redacted] weeks gestation of pregnancy                Z3A.33   Uterine size-date discrepancy, third trimester O26.843   (S>D)  ---------------------------------------------------------------------- OB History  Blood Type:            Height:  5'4"   Weight (lb):  199      BMI:   34.15  Gravidity:    1 ---------------------------------------------------------------------- Fetal Evaluation  Num Of Fetuses:     1  Fetal Heart         157  Rate(bpm):  Cardiac Activity:   Observed  Presentation:       Cephalic  Placenta:           Posterior, above cervical os  P. Cord Insertion:  Previously Visualized  Amniotic Fluid  AFI FV:      Polyhydramnios  AFI Sum(cm)     %Tile       Largest Pocket(cm)  28.96           > 97        10.69  RUQ(cm)       RLQ(cm)       LUQ(cm)        LLQ(cm)  10.69         9.01          7.21           2.05 ---------------------------------------------------------------------- Biophysical Evaluation  Amniotic F.V:   Polyhydramnios             F. Tone:        Observed  F. Movement:    Observed                   Score:          8/8  F. Breathing:   Observed ---------------------------------------------------------------------- Biometry  BPD:      86.1  mm     G. Age:  34w 5d         78  %    CI:        76.28   %   70 - 86                                                          FL/HC:      20.9   %   19.4 -  21.8  HC:      312.4  mm     G. Age:  35w 0d         50  %    HC/AC:      1.01       0.96 - 1.11  AC:      309.2  mm     G. Age:  34w 6d         84  %    FL/BPD:     75.7   %   71 - 87  FL:       65.2  mm     G.  Age:  33w 4d         41  %    FL/AC:      21.1   %   20 - 24  HUM:        59  mm     G. Age:  34w 1d         71  %  Est. FW:    2450  gm      5 lb 6 oz     76  % ---------------------------------------------------------------------- Gestational Age  LMP:           30w 6d       Date:   07/25/16                 EDD:   05/01/17  U/S Today:     34w 4d                                        EDD:   04/05/17  Best:          33w 4d    Det. By:   U/S  (10/24/16)          EDD:   04/12/17 ---------------------------------------------------------------------- Anatomy  Cranium:               Appears normal         Aortic Arch:            Appears normal  Cavum:                 Appears normal         Ductal Arch:            Appears normal  Ventricles:            Appears normal         Diaphragm:              Appears normal  Choroid Plexus:        Previously seen        Stomach:                Appears normal, left                                                                        sided  Cerebellum:            Appears normal         Abdomen:                Appears normal  Posterior Fossa:       Previously seen        Abdominal Wall:         Appears nml (cord  insert, abd wall)  Nuchal Fold:           Not applicable (>20    Cord Vessels:           Appears normal ([redacted]                         wks GA)                                        vessel cord)  Face:                  Appears normal         Kidneys:                Appear normal                         (orbits and profile)  Lips:                  Appears normal         Bladder:                Appears normal  Thoracic:              Appears normal         Spine:                  Previously seen  Heart:                  Appears normal         Upper Extremities:      Previously seen                         (4CH, axis, and situs  RVOT:                  Appears normal         Lower Extremities:      Previously seen  LVOT:                  Appears normal  Other:  Female gender previously seen. Right hand/5th digit visualized. Nasal          bone visualized. Heels previously seen. ---------------------------------------------------------------------- Cervix Uterus Adnexa  Cervix  Not visualized (advanced GA >29wks)  Uterus  No abnormality visualized.  Left Ovary  Not visualized.  Right Ovary  Not visualized.  Cul De Sac:   No free fluid seen.  Adnexa:       No abnormality visualized. ---------------------------------------------------------------------- Impression  SIUP at 33+4 weeks  Normal interval anatomy; normal stomach; anatomic survey  complete  Mild to moderate polyhydramnios  Appropriate interval growth with EFW at the 76th %tile  BPP 8/8  The US findings were shared with Ms. Palau. The  implications of increase amniotic fluid volume were discussed  in detail. Preterm labor precautions were reviewed. ---------------------------------------------------------------------- Recommendations  Weekly BPPs ----------------------------------------------------------------------                 Particia Nearing, MD Electronically Signed Final Report   02/27/2017 08:38 am ----------------------------------------------------------------------  [place] Mfm Ob Follow Up  Result Date: 02/27/2017 ----------------------------------------------------------------------  OBSTETRICS REPORT                      (  Signed Final 02/27/2017 08:38 am) ---------------------------------------------------------------------- Patient Info  ID #:       [id number]                         D.O.B.:   11-21-1996 (20 yrs)  Name:       QUINTARA BOST [facility]            Visit Date:  02/26/2017 02:42 pm  ---------------------------------------------------------------------- Performed By  Performed By:     Eden Lathe BS      Ref. Address:     [address]                    RDMS [place]T                                                             [address]d                                                             [place]                                                             [phone]  Attending:        Particia Nearing MD       Location:         [facility]  Referred By:      Levie Heritage                    MD ---------------------------------------------------------------------- Orders   #  Description                                 Code   1  [place] MFM OB FOLLOW UP                         [phone]   2  [place] MFM FETAL BPP WO NON STRESS              76819.01  ----------------------------------------------------------------------   #  Ordered By               Order #        Accession #    Episode #   1  Candelaria Celeste            [id number]      [id number]     [id number]   2  MARTHA DECKER            [id number]      [id number]     [id number]  ---------------------------------------------------------------------- Indications   [redacted] weeks gestation of pregnancy                Z3A.33   Uterine size-date discrepancy, third trimester O26.843   (S>D)  ----------------------------------------------------------------------  OB History  Blood Type:            Height:  5'4"   Weight (lb):  199      BMI:   34.15  Gravidity:    1 ---------------------------------------------------------------------- Fetal Evaluation  Num Of Fetuses:     1  Fetal Heart         157  Rate(bpm):  Cardiac Activity:   Observed  Presentation:       Cephalic  Placenta:           Posterior, above cervical os  P. Cord Insertion:  Previously Visualized  Amniotic Fluid  AFI FV:      Polyhydramnios  AFI Sum(cm)     %Tile       Largest Pocket(cm)  28.96           > 97        10.69  RUQ(cm)       RLQ(cm)       LUQ(cm)        LLQ(cm)  10.69         9.01           7.21           2.05 ---------------------------------------------------------------------- Biophysical Evaluation  Amniotic F.V:   Polyhydramnios             F. Tone:        Observed  F. Movement:    Observed                   Score:          8/8  F. Breathing:   Observed ---------------------------------------------------------------------- Biometry  BPD:      86.1  mm     G. Age:  34w 5d         78  %    CI:        76.28   %   70 - 86                                                          FL/HC:      20.9   %   19.4 - 21.8  HC:      312.4  mm     G. Age:  35w 0d         50  %    HC/AC:      1.01       0.96 - 1.11  AC:      309.2  mm     G. Age:  34w 6d         84  %    FL/BPD:     75.7   %   71 - 87  FL:       65.2  mm     G. Age:  33w 4d         41  %    FL/AC:      21.1   %   20 - 24  HUM:        59  mm     G. Age:  34w 1d         71  %  Est. FW:    2450  gm      5  lb 6 oz     76  % ---------------------------------------------------------------------- Gestational Age  LMP:           30w 6d       Date:   07/25/16                 EDD:   05/01/17  U/S Today:     34w 4d                                        EDD:   04/05/17  Best:          33w 4d    Det. By:   U/S  (10/24/16)          EDD:   04/12/17 ---------------------------------------------------------------------- Anatomy  Cranium:               Appears normal         Aortic Arch:            Appears normal  Cavum:                 Appears normal         Ductal Arch:            Appears normal  Ventricles:            Appears normal         Diaphragm:              Appears normal  Choroid Plexus:        Previously seen        Stomach:                Appears normal, left                                                                        sided  Cerebellum:            Appears normal         Abdomen:                Appears normal  Posterior Fossa:       Previously seen        Abdominal Wall:         Appears nml (cord                                                                         insert, abd wall)  Nuchal Fold:           Not applicable (>20    Cord Vessels:           Appears normal ([redacted]                         wks GA)  vessel cord)  Face:                  Appears normal         Kidneys:                Appear normal                         (orbits and profile)  Lips:                  Appears normal         Bladder:                Appears normal  Thoracic:              Appears normal         Spine:                  Previously seen  Heart:                 Appears normal         Upper Extremities:      Previously seen                         (4CH, axis, and situs  RVOT:                  Appears normal         Lower Extremities:      Previously seen  LVOT:                  Appears normal  Other:  Female gender previously seen. Right hand/5th digit visualized. Nasal          bone visualized. Heels previously seen. ---------------------------------------------------------------------- Cervix Uterus Adnexa  Cervix  Not visualized (advanced GA >29wks)  Uterus  No abnormality visualized.  Left Ovary  Not visualized.  Right Ovary  Not visualized.  Cul De Sac:   No free fluid seen.  Adnexa:       No abnormality visualized. ---------------------------------------------------------------------- Impression  SIUP at 33+4 weeks  Normal interval anatomy; normal stomach; anatomic survey  complete  Mild to moderate polyhydramnios  Appropriate interval growth with EFW at the 76th %tile  BPP 8/8  The US findings were shared with Ms. Herling. The  implications of increase amniotic fluid volume were discussed  in detail. Preterm labor precautions were reviewed. ---------------------------------------------------------------------- Recommendations  Weekly BPPs ----------------------------------------------------------------------                 Particia Nearing, MD Electronically Signed Final Report   02/27/2017 08:38 am  ----------------------------------------------------------------------   Assessment and Plan:  Pregnancy: G1P0 at [redacted]w[redacted]d  1. Polyhydramnios in singleton pregnancy in third trimester Continue weekly BPP, IOL scheduled at 39 weeks.   2. Supervision of high risk pregnancy in third trimester Term labor symptoms and general obstetric precautions including but not limited to vaginal bleeding, contractions, leaking of fluid and fetal movement were reviewed in detail with the patient. Please refer to After Visit Summary for other counseling recommendations.  Return in about 6 weeks (around 05/09/2017) for Postpartum check.   Tereso Newcomer, MD

## 2017-03-28 NOTE — Patient Instructions (Signed)
Return to clinic for any scheduled appointments or obstetric concerns, or go to [facility] for evaluation   Labor Induction Labor induction is when steps are taken to cause a pregnant woman to begin the labor process. Most women go into labor on their own between 37 weeks and 42 weeks of the pregnancy. When this does not happen or when there is a medical need, methods may be used to induce labor. Labor induction causes a pregnant woman's uterus to contract. It also causes the cervix to soften (ripen), open (dilate), and thin out (efface). Usually, labor is not induced before 39 weeks of the pregnancy unless there is a problem with the baby or mother. Before inducing labor, your health care provider will consider a number of factors, including the following:  The medical condition of you and the baby.  How many weeks along you are.  The status of the baby's lung maturity.  The condition of the cervix.  The position of the baby.  What are the reasons for labor induction? Labor may be induced for the following reasons:  The health of the baby or mother is at risk.  The pregnancy is overdue by 1 week or more.  The water breaks but labor does not start on its own.  The mother has a health condition or serious illness, such as high blood pressure, infection, placental abruption, or diabetes.  The amniotic fluid amounts are low around the baby.  The baby is distressed.  Convenience or wanting the baby to be born on a certain date is not a reason for inducing labor. What methods are used for labor induction? Several methods of labor induction may be used, such as:  Prostaglandin medicine. This medicine causes the cervix to dilate and ripen. The medicine will also start contractions. It can be taken by mouth or by inserting a suppository into the vagina.  Inserting a thin tube (catheter) with a balloon on the end into the vagina to dilate the cervix. Once inserted, the balloon is expanded  with water, which causes the cervix to open.  Stripping the membranes. Your health care provider separates amniotic sac tissue from the cervix, causing the cervix to be stretched and causing the release of a hormone called progesterone. This may cause the uterus to contract. It is often done during an office visit. You will be sent home to wait for the contractions to begin. You will then come in for an induction.  Breaking the water. Your health care provider makes a hole in the amniotic sac using a small instrument. Once the amniotic sac breaks, contractions should begin. This may still take hours to see an effect.  Medicine to trigger or strengthen contractions. This medicine is given through an IV access tube inserted into a vein in your arm.  All of the methods of induction, besides stripping the membranes, will be done in the hospital. Induction is done in the hospital so that you and the baby can be carefully monitored. How long does it take for labor to be induced? Some inductions can take up to 2-3 days. Depending on the cervix, it usually takes less time. It takes longer when you are induced early in the pregnancy or if this is your first pregnancy. If a mother is still pregnant and the induction has been going on for 2-3 days, either the mother will be sent home or a cesarean delivery will be needed. What are the risks associated with labor induction? Some of the risks   of induction include:  Changes in fetal heart rate, such as too high, too low, or erratic.  Fetal distress.  Chance of infection for the mother and baby.  Increased chance of having a cesarean delivery.  Breaking off (abruption) of the placenta from the uterus (rare).  Uterine rupture (very rare).  When induction is needed for medical reasons, the benefits of induction may outweigh the risks. What are some reasons for not inducing labor? Labor induction should not be done if:  It is shown that your baby does  not tolerate labor.  You have had previous surgeries on your uterus, such as a myomectomy or the removal of fibroids.  Your placenta lies very low in the uterus and blocks the opening of the cervix (placenta previa).  Your baby is not in a head-down position.  The umbilical cord drops down into the birth canal in front of the baby. This could cut off the baby's blood and oxygen supply.  You have had a previous cesarean delivery.  There are unusual circumstances, such as the baby being extremely premature.  This information is not intended to replace advice given to you by your health care provider. Make sure you discuss any questions you have with your health care provider. Document Released: 05/02/2007 Document Revised: 05/18/2016 Document Reviewed: 07/10/2013 Elsevier Interactive Patient Education  2017 [employer]  

## 2017-03-29 ENCOUNTER — Ambulatory Visit (HOSPITAL_COMMUNITY)
Admission: RE | Admit: 2017-03-29 | Discharge: 2017-03-29 | Disposition: A | Discharge status: Home / Self Care | Payer: Medicaid Other | Source: Ambulatory Visit | Attending: Family Medicine | Admitting: Family Medicine

## 2017-03-29 ENCOUNTER — Telehealth (HOSPITAL_COMMUNITY): Payer: Self-pay | Admitting: *Deleted

## 2017-03-29 ENCOUNTER — Encounter (HOSPITAL_COMMUNITY): Payer: Self-pay

## 2017-03-29 DIAGNOSIS — O403XX0 Polyhydramnios, third trimester, not applicable or unspecified: Secondary | ICD-10-CM

## 2017-03-29 DIAGNOSIS — O99213 Obesity complicating pregnancy, third trimester: Secondary | ICD-10-CM | POA: Insufficient documentation

## 2017-03-29 DIAGNOSIS — Z3A38 38 weeks gestation of pregnancy: Secondary | ICD-10-CM | POA: Diagnosis not present

## 2017-03-29 NOTE — Telephone Encounter (Signed)
Preadmission screen  

## 2017-04-04 ENCOUNTER — Other Ambulatory Visit: Payer: Self-pay | Admitting: Obstetrics and Gynecology

## 2017-04-05 ENCOUNTER — Inpatient Hospital Stay (HOSPITAL_COMMUNITY)
Admission: RE | Admit: 2017-04-05 | Discharge: 2017-04-09 | DRG: 775 | Disposition: A | Discharge status: Home / Self Care | Payer: Medicaid Other | Source: Ambulatory Visit | Attending: Obstetrics and Gynecology | Admitting: Obstetrics and Gynecology

## 2017-04-05 DIAGNOSIS — Z68.41 Body mass index (BMI) pediatric, 85th percentile to less than 95th percentile for age: Secondary | ICD-10-CM | POA: Diagnosis not present

## 2017-04-05 DIAGNOSIS — O99214 Obesity complicating childbirth: Secondary | ICD-10-CM | POA: Diagnosis present

## 2017-04-05 DIAGNOSIS — K219 Gastro-esophageal reflux disease without esophagitis: Secondary | ICD-10-CM | POA: Diagnosis present

## 2017-04-05 DIAGNOSIS — O99824 Streptococcus B carrier state complicating childbirth: Secondary | ICD-10-CM | POA: Diagnosis present

## 2017-04-05 DIAGNOSIS — O403XX0 Polyhydramnios, third trimester, not applicable or unspecified: Secondary | ICD-10-CM

## 2017-04-05 DIAGNOSIS — Z3A39 39 weeks gestation of pregnancy: Secondary | ICD-10-CM | POA: Diagnosis not present

## 2017-04-05 DIAGNOSIS — O99820 Streptococcus B carrier state complicating pregnancy: Secondary | ICD-10-CM

## 2017-04-05 DIAGNOSIS — O9962 Diseases of the digestive system complicating childbirth: Secondary | ICD-10-CM | POA: Diagnosis present

## 2017-04-05 DIAGNOSIS — O409XX0 Polyhydramnios, unspecified trimester, not applicable or unspecified: Secondary | ICD-10-CM | POA: Diagnosis present

## 2017-04-05 LAB — CBC
HCT: 33.9 % — ABNORMAL LOW (ref 36.0–46.0)
HEMOGLOBIN: 11.5 g/dL — AB (ref 12.0–15.0)
MCH: 30.3 pg (ref 26.0–34.0)
MCHC: 33.9 g/dL (ref 30.0–36.0)
MCV: 89.2 fL (ref 78.0–100.0)
Platelets: 234 10*3/uL (ref 150–400)
RBC: 3.8 MIL/uL — AB (ref 3.87–5.11)
RDW: 14.3 % (ref 11.5–15.5)
WBC: 7.1 10*3/uL (ref 4.0–10.5)

## 2017-04-05 LAB — TYPE AND SCREEN
ABO/RH(D): A POS
ANTIBODY SCREEN: NEGATIVE

## 2017-04-05 LAB — ABO/RH: ABO/RH(D): A POS

## 2017-04-05 LAB — RPR: RPR: NONREACTIVE

## 2017-04-05 MED ORDER — OXYTOCIN 40 UNITS IN LACTATED RINGERS INFUSION - SIMPLE MED: 2.5000 [IU]/h | INTRAVENOUS | Status: DC

## 2017-04-05 MED ORDER — TERBUTALINE SULFATE 1 MG/ML IJ SOLN
0.2500 mg | Freq: Once | INTRAMUSCULAR | Status: DC | PRN
  Filled 2017-04-05: qty 1

## 2017-04-05 MED ORDER — SOD CITRATE-CITRIC ACID 500-334 MG/5ML PO SOLN
30.0000 mL | ORAL | Status: DC | PRN
  Administered 2017-04-05 – 2017-04-06 (×3): 30 mL via ORAL
  Filled 2017-04-05 (×3): qty 15

## 2017-04-05 MED ORDER — LACTATED RINGERS IV SOLN
500.0000 mL | INTRAVENOUS | Status: DC | PRN
  Administered 2017-04-06 (×3): 1000 mL via INTRAVENOUS

## 2017-04-05 MED ORDER — ONDANSETRON HCL 4 MG/2ML IJ SOLN: 4.0000 mg | Freq: Four times a day (QID) | INTRAMUSCULAR | Status: DC | PRN

## 2017-04-05 MED ORDER — FENTANYL CITRATE (PF) 100 MCG/2ML IJ SOLN
100.0000 ug | INTRAMUSCULAR | Status: DC | PRN
  Administered 2017-04-05 (×2): 100 ug via INTRAVENOUS
  Filled 2017-04-05 (×2): qty 2

## 2017-04-05 MED ORDER — PENICILLIN G POT IN DEXTROSE 60000 UNIT/ML IV SOLN
3.0000 10*6.[IU] | INTRAVENOUS | Status: DC
  Administered 2017-04-05 – 2017-04-07 (×10): 3 10*6.[IU] via INTRAVENOUS
  Filled 2017-04-05 (×13): qty 50

## 2017-04-05 MED ORDER — LIDOCAINE HCL (PF) 1 % IJ SOLN
30.0000 mL | INTRAMUSCULAR | Status: DC | PRN
  Filled 2017-04-05: qty 30

## 2017-04-05 MED ORDER — OXYTOCIN BOLUS FROM INFUSION
500.0000 mL | Freq: Once | INTRAVENOUS | Status: AC
  Administered 2017-04-07: 500 mL via INTRAVENOUS

## 2017-04-05 MED ORDER — FLEET ENEMA 7-19 GM/118ML RE ENEM: 1.0000 | ENEMA | RECTAL | Status: DC | PRN

## 2017-04-05 MED ORDER — OXYTOCIN 40 UNITS IN LACTATED RINGERS INFUSION - SIMPLE MED
1.0000 m[IU]/min | INTRAVENOUS | Status: DC
  Administered 2017-04-05 – 2017-04-06 (×2): 2 m[IU]/min via INTRAVENOUS
  Filled 2017-04-05: qty 1000

## 2017-04-05 MED ORDER — OXYCODONE-ACETAMINOPHEN 5-325 MG PO TABS: 2.0000 | ORAL_TABLET | ORAL | Status: DC | PRN

## 2017-04-05 MED ORDER — LACTATED RINGERS IV SOLN
INTRAVENOUS | Status: DC
  Administered 2017-04-05: 125 mL via INTRAVENOUS
  Administered 2017-04-05 – 2017-04-06 (×2): via INTRAVENOUS

## 2017-04-05 MED ORDER — MISOPROSTOL 25 MCG QUARTER TABLET
25.0000 ug | ORAL_TABLET | ORAL | Status: DC | PRN
  Administered 2017-04-05 (×2): 25 ug via VAGINAL
  Filled 2017-04-05 (×3): qty 1

## 2017-04-05 MED ORDER — ACETAMINOPHEN 325 MG PO TABS: 650.0000 mg | ORAL_TABLET | ORAL | Status: DC | PRN

## 2017-04-05 MED ORDER — PENICILLIN G POTASSIUM 5000000 UNITS IJ SOLR
5.0000 10*6.[IU] | Freq: Once | INTRAVENOUS | Status: AC
  Administered 2017-04-05: 5 10*6.[IU] via INTRAVENOUS
  Filled 2017-04-05: qty 5

## 2017-04-05 MED ORDER — OXYCODONE-ACETAMINOPHEN 5-325 MG PO TABS: 1.0000 | ORAL_TABLET | ORAL | Status: DC | PRN

## 2017-04-05 NOTE — Progress Notes (Addendum)
LABOR PROGRESS NOTE  Angelica Bowers is a 21 y.o. G1P0 at [redacted]w[redacted]d  admitted for IOL in the setting of polyhydramnios  Subjective: Patient denies any pain at the time. She denies feeling contractions and would like to sit on the ball.  Objective: BP (!) 123/56   Pulse (!) 103   Temp 98.3 F (36.8 C) (Oral)   Resp 18   Ht  (1.626 m)   Wt 248 lb (112.5 kg)   LMP 07/25/2016 (Approximate) Comment: Unsure of LMP  BMI 42.57 kg/m  or  Vitals:   04/05/17 0741 04/05/17 0824 04/05/17 1009 04/05/17 1205  BP:  126/63 135/79 (!) 123/56  Pulse:  76 92 (!) 103  Resp:  Temp:    98.3 F (36.8 C)  TempSrc:    Oral  Weight: 248 lb (112.5 kg)     Height:  (1.626 m)        Dilation: 2.5 Effacement (%): 80 Station: -2 Presentation: Vertex Exam by:: Carloyn Jaeger, CNM  Labs: Lab Results  Component Value Date   WBC 7.1 04/05/2017   HGB 11.5 (L) 04/05/2017   HCT 33.9 (L) 04/05/2017   MCV 89.2 04/05/2017   PLT 234 04/05/2017    Patient Active Problem List   Diagnosis Date Noted  . Polyhydramnios affecting pregnancy 04/05/2017  . Group B Streptococcus carrier, +RV culture, currently pregnant 03/21/2017  . Polyhydramnios in singleton pregnancy in third trimester 03/05/2017  . Supervision of high risk pregnancy in third trimester 09/27/2016    Assessment / Plan: 21 y.o. G1P0 at [redacted]w[redacted]d here for IOL for polyhydramnios.. Did not place a foley given advanced cervical ripening and dilation.  Labor: augmented with pitocin Fetal Wellbeing: Category1 Pain Control:  IV pain meds Anticipated MOD:  vaginal  Lovena Neighbours, MD 04/05/2017, 2:19 PM

## 2017-04-05 NOTE — H&P (Signed)
I confirm that I have verified the information documented in the resident's note and that I have also personally reperformed the physical exam and all medical decision making activities. Kenard Gower, CNM 04/05/2017 10:18 AM    LABOR AND DELIVERY ADMISSION HISTORY AND PHYSICAL NOTE  Angelica Bowers is a 21 y.o. female G1P0 with IUP at [redacted]w[redacted]d by LMP confirmed on [place] by week 9 presenting for IOL in the setting of polyhydramnios (AFI 19). Patient had routine prenatal care with weekly BPP and NST. No other complications during pregnancy.   She reports positive fetal movement. She denies leakage of fluid or vaginal bleeding.  Prenatal History/Complications:  Past Medical History: No past medical history on file.  Past Surgical History: Past Surgical History:  Procedure Laterality Date  . EYE SURGERY      Obstetrical History: OB History    Gravida Para Term Preterm AB Living   1             SAB TAB Ectopic Multiple Live Births                  Social History: Social History   Social History  . Marital status: Single    Spouse name: N/A  . Number of children: N/A  . Years of education: N/A   Social History Main Topics  . Smoking status: Never Smoker  . Smokeless tobacco: Never Used  . Alcohol use No  . Drug use: No  . Sexual activity: Yes   Other Topics Concern  . Not on file   Social History Narrative   ** Merged History Encounter **        Family History: Family History  Problem Relation Age of Onset  . Cancer Neg Hx   . Diabetes Neg Hx   . Hypertension Neg Hx     Allergies: No Known Allergies  Prescriptions Prior to Admission  Medication Sig Dispense Refill Last Dose  . Calcium Carbonate Antacid (TUMS PO) Take by mouth.   Taking  . DiphenhydrAMINE HCl (BENADRYL ALLERGY PO) Take by mouth.   Taking  . famotidine (PEPCID) 20 MG tablet Take 1 tablet (20 mg total) by mouth 2 (two) times daily. (Patient not taking: Reported on 03/29/2017) 60 tablet 3  Not Taking  . Prenatal Vit-Fe Fumarate-FA (MULTIVITAMIN-PRENATAL) 27-0.8 MG TABS tablet Take 1 tablet by mouth daily at 12 noon. 30 each 10 Taking     Review of Systems   All systems reviewed and negative except as stated in HPI  Last menstrual period 07/25/2016. General appearance: alert, cooperative and appears stated age Lungs: Normal respiratory effort, no audible wheezing Heart: regular rate and pulses palpated distally Abdomen: soft, non-tender, gravid appropriate for gestational age Extremities: No calf swelling or tenderness Presentation: cephalic by nurse exam Fetal monitoring: FHR 140, good variability Uterine activity: minimal contractions     Prenatal labs: ABO, Rh: A/POS/-- (10/04 1624) Antibody: NEG (10/04 1624) Rubella: Immune  RPR: Non Reactive (02/12 1020)  HBsAg: NEGATIVE (10/04 1624)  HIV: Non Reactive (02/12 1020)  GBS:   Positive 1 hr Glucola: unknown Genetic screening: QUAD negative Anatomy US: Normal  Prenatal Transfer Tool  Maternal Diabetes: No Genetic Screening: Normal Maternal Ultrasounds/Referrals: Normal Fetal Ultrasounds or other Referrals:  None Maternal Substance Abuse:  No Significant Maternal Medications:  None Significant Maternal Lab Results: Lab values include: Group B Strep positive  No results found for this or any previous visit (from the past 24 hour(s)).  Patient Active Problem List  Diagnosis Date Noted  . Polyhydramnios affecting pregnancy 04/05/2017  . Group B Streptococcus carrier, +RV culture, currently pregnant 03/21/2017  . Polyhydramnios in singleton pregnancy in third trimester 03/05/2017  . Supervision of high risk pregnancy in third trimester 09/27/2016    Assessment: Angelica Bowers is a 21 y.o. G1P0 at [redacted]w[redacted]d here for IOL in the setting of polyhydramnios. Fetal movement present. Currently mild contractions, pain is well controlled. FHR 130, good variability, no decel. Will continue induction and  augmentation process.  #Labor: IOL, Augmentation with cytotec #Pain: IV pain meds, considering epidural  #FWB: Category 1 #ID: GBS + treated with PCN #MOF: Bottle, might try breasfeeding #MOC:OCP #Circ:  Outpatient  Abdoulaye Diallo 04/05/2017, 7:29 AM

## 2017-04-05 NOTE — Anesthesia Pain Management Evaluation Note (Signed)
  CRNA Pain Management Visit Note  Patient: Angelica Bowers, 21 y.o., female  "Hello I am a member of the anesthesia team at [facility]. We have an anesthesia team available at all times to provide care throughout the hospital, including epidural management and anesthesia for C-section. I don't know your plan for the delivery whether it a natural birth, water birth, IV sedation, nitrous supplementation, doula or epidural, but we want to meet your pain goals."   1.Was your pain managed to your expectations on prior hospitalizations?   No prior hospitalizations  2.What is your expectation for pain management during this hospitalization?     Epidural and IV pain meds  3.How can we help you reach that goal? IV pain meds until epidural needed  Record the patient's initial score and the patient's pain goal.   Pain: 0  Pain Goal: 4 The [facility] wants you to be able to say your pain was always managed very well.  Madison Hickman 04/05/2017

## 2017-04-05 NOTE — Progress Notes (Signed)
LABOR PROGRESS NOTE  BIRD TAILOR is a 21 y.o. G1P0 at [redacted]w[redacted]d  admitted for IOL in the setting of polyhydramnios.  Subjective: Patient continue to do well, she denies feeling any contractions at the moment.  Patient has been requesting food but is feeling comfortable. She still does not want a epidural.  Objective: BP 106/65   Pulse 74   Temp 98.5 F (36.9 C) (Oral)   Resp 18   Ht  (1.626 m)   Wt 248 lb (112.5 kg)   LMP 07/25/2016 (Approximate) Comment: Unsure of LMP  BMI 42.57 kg/m  or  Vitals:   04/05/17 1549 04/05/17 1630 04/05/17 1700 04/05/17 1730  BP: 121/67 126/62 (!) 104/53 106/65  Pulse: 81 89 87 74  Resp: 18  18   Temp: 98.5 F (36.9 C)     TempSrc: Oral     Weight:      Height:        Cardiac, pulmonary and abdominal exam within normal limit. Dilation: 2.5 Effacement (%): 80 Station: -2 Presentation: Vertex (per U/S per Hubert Azure RNC/ Dr. Genevie Ann) Exam by:: Carloyn Jaeger, CNM  Labs: Lab Results  Component Value Date   WBC 7.1 04/05/2017   HGB 11.5 (L) 04/05/2017   HCT 33.9 (L) 04/05/2017   MCV 89.2 04/05/2017   PLT 234 04/05/2017    Patient Active Problem List   Diagnosis Date Noted  . Polyhydramnios affecting pregnancy 04/05/2017  . Group B Streptococcus carrier, +RV culture, currently pregnant 03/21/2017  . Polyhydramnios in singleton pregnancy in third trimester 03/05/2017  . Supervision of high risk pregnancy in third trimester 09/27/2016    Assessment / Plan: 21 y.o. G1P0 at [redacted]w[redacted]d here for IOL in the setting of polyhydramnios. Contractions are more frequent q4-5 min. FHR 130 good variations, no decel. Will continue expectant management.   Labor: Augmented with pitocin Fetal Wellbeing: Category 1 Pain Control:  IV pain meds Anticipated MOD: Vaginal  Lovena Neighbours, MD 04/05/2017, 5:54 PM

## 2017-04-05 NOTE — Progress Notes (Signed)
Vitals:   04/05/17 1932 04/05/17 2042  BP: 103/81 130/71  Pulse:  66  Resp: 18 18  Temp:     FHR 135, cat 1.  Ctx q 1-2 minutes, mild.  Pt says they are getting stronger.  Pitocin at 8 mu/min.  Will continue present management.

## 2017-04-06 ENCOUNTER — Inpatient Hospital Stay (HOSPITAL_COMMUNITY): Payer: Medicaid Other | Admitting: Anesthesiology

## 2017-04-06 MED ORDER — DIPHENHYDRAMINE HCL 50 MG/ML IJ SOLN
12.5000 mg | INTRAMUSCULAR | Status: DC | PRN
  Administered 2017-04-06 (×2): 12.5 mg via INTRAVENOUS
  Filled 2017-04-06 (×2): qty 1

## 2017-04-06 MED ORDER — LIDOCAINE HCL (PF) 1 % IJ SOLN
INTRAMUSCULAR | Status: DC | PRN
  Administered 2017-04-06: 13 mL via EPIDURAL

## 2017-04-06 MED ORDER — LACTATED RINGERS IV SOLN
INTRAVENOUS | Status: DC
  Administered 2017-04-06: 20:00:00 via INTRAUTERINE

## 2017-04-06 MED ORDER — EPHEDRINE 5 MG/ML INJ
10.0000 mg | INTRAVENOUS | Status: DC | PRN
Start: 2017-04-06 — End: 2017-04-07
  Filled 2017-04-06: qty 2

## 2017-04-06 MED ORDER — FENTANYL 2.5 MCG/ML BUPIVACAINE 1/10 % EPIDURAL INFUSION (WH - ANES)
14.0000 mL/h | INTRAMUSCULAR | Status: DC | PRN
  Administered 2017-04-06 – 2017-04-07 (×4): 14 mL/h via EPIDURAL
  Filled 2017-04-06 (×4): qty 100

## 2017-04-06 MED ORDER — EPHEDRINE 5 MG/ML INJ
10.0000 mg | INTRAVENOUS | Status: DC | PRN
  Filled 2017-04-06: qty 2

## 2017-04-06 MED ORDER — PHENYLEPHRINE 40 MCG/ML (10ML) SYRINGE FOR IV PUSH (FOR BLOOD PRESSURE SUPPORT)
80.0000 ug | PREFILLED_SYRINGE | INTRAVENOUS | Status: DC | PRN
  Filled 2017-04-06: qty 5

## 2017-04-06 MED ORDER — LACTATED RINGERS IV SOLN: 500.0000 mL | Freq: Once | INTRAVENOUS | Status: DC

## 2017-04-06 MED ORDER — PHENYLEPHRINE 40 MCG/ML (10ML) SYRINGE FOR IV PUSH (FOR BLOOD PRESSURE SUPPORT)
80.0000 ug | PREFILLED_SYRINGE | INTRAVENOUS | Status: DC | PRN
  Filled 2017-04-06: qty 10
  Filled 2017-04-06: qty 5

## 2017-04-06 NOTE — Progress Notes (Signed)
Vitals:   04/06/17 0731 04/06/17 0801  BP: (!) 126/59 (!) 132/54  Pulse: 86 72  Resp: 20 20  Temp: 98.8 F (37.1 C)    Pit had been turned off at 0645 Dt variables.  Now cat 1 for almost ann hour.  RN instructed to restart pitocin.

## 2017-04-06 NOTE — Anesthesia Procedure Notes (Signed)
Epidural Patient location during procedure: OB Start time: 04/06/2017 12:51 AM End time: 04/06/2017 1:08 AM  Staffing Anesthesiologist: Anitra Lauth RAY Performed: anesthesiologist   Preanesthetic Checklist Completed: patient identified, site marked, surgical consent, pre-op evaluation, timeout performed, IV checked, risks and benefits discussed and monitors and equipment checked  Epidural Patient position: sitting Prep: DuraPrep Patient monitoring: heart rate, cardiac monitor, continuous pulse ox and blood pressure Approach: midline Location: L2-L3 Injection technique: LOR saline  Needle:  Needle type: Tuohy  Needle gauge: 17 G Needle length: 9 cm Needle insertion depth: 6 cm Catheter type: closed end flexible Catheter size: 20 Guage Catheter at skin depth: 10 cm Test dose: negative  Assessment Events: blood not aspirated, injection not painful, no injection resistance, negative IV test and no paresthesia  Additional Notes Reason for block:procedure for pain

## 2017-04-06 NOTE — Progress Notes (Addendum)
   Angelica Bowers is a 21 y.o. G1P0 at [redacted]w[redacted]d  admitted for induction of labor due to Hydramnios.  Subjective: Just got an epidural  Objective: Vitals:   04/05/17 1932 04/05/17 2042 04/05/17 2201 04/05/17 2300  BP: 103/81 130/71 122/64 128/65  Pulse:  66 74 80  Resp: Temp:  98.2 F (36.8 C)    TempSrc:  Oral    Weight:      Height:       No intake/output data recorded.  FHT:  FHR: 140 bpm, variability: moderate,  accelerations:  Present,  decelerations:  Absent UC:   2-5 SVE:   Dilation: 2.5 Effacement (%): 80 Station: -2 Exam by:: Melburn Popper, RN Pitocin @ 14 mu/min  Labs: Lab Results  Component Value Date   WBC 7.1 04/05/2017   HGB 11.5 (L) 04/05/2017   HCT 33.9 (L) 04/05/2017   MCV 89.2 04/05/2017   PLT 234 04/05/2017    Assessment / Plan: IOL for polyhydramnios Increase pitocin until labor adequate Labor: early Fetal Wellbeing:  Category I Pain Control:  epidural Anticipated MOD:  NSVD  CRESENZO-DISHMAN,Jacolby Risby 04/06/2017, 12:55 AM

## 2017-04-06 NOTE — Progress Notes (Signed)
Labor Progress Note Angelica Bowers is a 21 y.o. G1P0 at [redacted]w[redacted]d presented for IOL for polyhydraminous S: sleepy but arises easily. No complaint  O:  BP (!) 115/51   Pulse 65   Temp 98.4 F (36.9 C) (Oral)   Resp 18   Ht  (1.626 m)   Wt 248 lb (112.5 kg)   LMP 07/25/2016 (Approximate) Comment: Unsure of LMP  BMI 42.57 kg/m  EFM: 140/mod var/no decels  CVE: Dilation: 5.5 Effacement (%): 80 Cervical Position: Anterior Station: -1, 0 Presentation: Vertex Exam by:: amwalker,rn   A&P: 21 y.o. G1P0 [redacted]w[redacted]d IOL for for polyhydraminous #Labor: progressing. Pit resumed #Pain: Epidural #FWB: CAT-1 #GBS: pos. PCN  Almon Hercules, MD 9:32 PM

## 2017-04-06 NOTE — Anesthesia Preprocedure Evaluation (Signed)
Anesthesia Evaluation  Patient identified by MRN, date of birth, ID band Patient awake    Reviewed: Allergy & Precautions, NPO status , Patient's Chart, lab work & pertinent test results  Airway Mallampati: II  TM Distance: >3 FB Neck ROM: Full    Dental no notable dental hx.    Pulmonary neg pulmonary ROS,    Pulmonary exam normal breath sounds clear to auscultation       Cardiovascular negative cardio ROS Normal cardiovascular exam Rhythm:Regular Rate:Normal     Neuro/Psych negative neurological ROS  negative psych ROS   GI/Hepatic negative GI ROS, Neg liver ROS, GERD  ,  Endo/Other  negative endocrine ROSMorbid obesity  Renal/GU negative Renal ROS  negative genitourinary   Musculoskeletal negative musculoskeletal ROS (+)   Abdominal   Peds negative pediatric ROS (+)  Hematology negative hematology ROS (+)   Anesthesia Other Findings   Reproductive/Obstetrics negative OB ROS (+) Pregnancy                             Anesthesia Physical Anesthesia Plan  ASA: III  Anesthesia Plan: Epidural   Post-op Pain Management:    Induction:   Airway Management Planned:   Additional Equipment:   Intra-op Plan:   Post-operative Plan:   Informed Consent: I have reviewed the patients History and Physical, chart, labs and discussed the procedure including the risks, benefits and alternatives for the proposed anesthesia with the patient or authorized representative who has indicated his/her understanding and acceptance.   Dental advisory given  Plan Discussed with: CRNA  Anesthesia Plan Comments:         Anesthesia Quick Evaluation

## 2017-04-06 NOTE — Progress Notes (Signed)
Labor Progress Note  S: getting variables with each contraction which are far apart, discussed starting amnioinfusion prior to restarting pit and pt agreeable  O:  BP 122/90 (BP Location: Left Arm)   Pulse 82   Temp 98.4 F (36.9 C) (Oral)   Resp 18   Ht  (1.626 m)   Wt 112.5 kg (248 lb)   LMP 07/25/2016 (Approximate) Comment: Unsure of LMP  BMI 42.57 kg/m  EFM: 135, mod var, variable decels, no lates ONG:EXBMWUXL: 4 Effacement (%): 70 Cervical Position: Anterior Station: 0 Presentation: Vertex Exam by:: Philipp Deputy, CNM   A&P: 21 y.o. G1P0 [redacted]w[redacted]d IOL for polyhydramnios #Labor:amnioinfusion, then consider starting pitocin if FHR tolerates contractions better #FWB: Category 2 tracing #GBS: positive, being tx with PCN  Durenda Hurt, MD Resident Physician 8:34 PM

## 2017-04-06 NOTE — Progress Notes (Signed)
Patient ID: Angelica Bowers, female   DOB: May 21, 1996, 21 y.o.   MRN: [id number]  Pit stopped earlier due to variables; restarted @ 1500 at 35mu/min and now is at 83mu/min. FHR variable- late onset. Cx 4/70. AROM for clear fluid and IUPC inserted. FHR late onset variables continued, and so now Pit stopped. May use Terb, and may start amnioinfusion.  Cam Hai CNM 04/06/2017 4:50 PM

## 2017-04-07 ENCOUNTER — Encounter (HOSPITAL_COMMUNITY): Payer: Self-pay

## 2017-04-07 DIAGNOSIS — O99824 Streptococcus B carrier state complicating childbirth: Secondary | ICD-10-CM

## 2017-04-07 DIAGNOSIS — O403XX0 Polyhydramnios, third trimester, not applicable or unspecified: Secondary | ICD-10-CM

## 2017-04-07 DIAGNOSIS — Z3A39 39 weeks gestation of pregnancy: Secondary | ICD-10-CM

## 2017-04-07 LAB — CBC
HCT: 31.5 % — ABNORMAL LOW (ref 36.0–46.0)
Hemoglobin: 10.8 g/dL — ABNORMAL LOW (ref 12.0–15.0)
MCH: 30.4 pg (ref 26.0–34.0)
MCHC: 34.3 g/dL (ref 30.0–36.0)
MCV: 88.7 fL (ref 78.0–100.0)
PLATELETS: 171 10*3/uL (ref 150–400)
RBC: 3.55 MIL/uL — AB (ref 3.87–5.11)
RDW: 14.3 % (ref 11.5–15.5)
WBC: 14.3 10*3/uL — ABNORMAL HIGH (ref 4.0–10.5)

## 2017-04-07 MED ORDER — IBUPROFEN 600 MG PO TABS
600.0000 mg | ORAL_TABLET | Freq: Four times a day (QID) | ORAL | Status: DC
  Administered 2017-04-07 – 2017-04-09 (×10): 600 mg via ORAL
  Filled 2017-04-07 (×10): qty 1

## 2017-04-07 MED ORDER — BENZOCAINE-MENTHOL 20-0.5 % EX AERO
1.0000 | INHALATION_SPRAY | CUTANEOUS | Status: DC | PRN
Start: 2017-04-07 — End: 2017-04-09
  Administered 2017-04-09: 1 via TOPICAL
  Filled 2017-04-07 (×2): qty 56

## 2017-04-07 MED ORDER — ACETAMINOPHEN 325 MG PO TABS
650.0000 mg | ORAL_TABLET | ORAL | Status: DC | PRN
  Administered 2017-04-07: 650 mg via ORAL
  Filled 2017-04-07: qty 2

## 2017-04-07 MED ORDER — ZOLPIDEM TARTRATE 5 MG PO TABS: 5.0000 mg | ORAL_TABLET | Freq: Every evening | ORAL | Status: DC | PRN

## 2017-04-07 MED ORDER — SIMETHICONE 80 MG PO CHEW: 80.0000 mg | CHEWABLE_TABLET | ORAL | Status: DC | PRN

## 2017-04-07 MED ORDER — COCONUT OIL OIL: 1.0000 "application " | TOPICAL_OIL | Status: DC | PRN

## 2017-04-07 MED ORDER — TETANUS-DIPHTH-ACELL PERTUSSIS 5-2.5-18.5 LF-MCG/0.5 IM SUSP: 0.5000 mL | Freq: Once | INTRAMUSCULAR | Status: DC

## 2017-04-07 MED ORDER — WITCH HAZEL-GLYCERIN EX PADS
1.0000 | MEDICATED_PAD | CUTANEOUS | Status: DC | PRN
Start: 2017-04-07 — End: 2017-04-09

## 2017-04-07 MED ORDER — SENNOSIDES-DOCUSATE SODIUM 8.6-50 MG PO TABS
2.0000 | ORAL_TABLET | ORAL | Status: DC
  Administered 2017-04-07 – 2017-04-09 (×2): 2 via ORAL
  Filled 2017-04-07 (×2): qty 2

## 2017-04-07 MED ORDER — DIBUCAINE 1 % RE OINT: 1.0000 "application " | TOPICAL_OINTMENT | RECTAL | Status: DC | PRN

## 2017-04-07 MED ORDER — ONDANSETRON HCL 4 MG PO TABS: 4.0000 mg | ORAL_TABLET | ORAL | Status: DC | PRN

## 2017-04-07 MED ORDER — ONDANSETRON HCL 4 MG/2ML IJ SOLN: 4.0000 mg | INTRAMUSCULAR | Status: DC | PRN

## 2017-04-07 MED ORDER — PRENATAL MULTIVITAMIN CH
1.0000 | ORAL_TABLET | Freq: Every day | ORAL | Status: DC
  Administered 2017-04-07 – 2017-04-09 (×3): 1 via ORAL
  Filled 2017-04-07 (×3): qty 1

## 2017-04-07 MED ORDER — DIPHENHYDRAMINE HCL 25 MG PO CAPS: 25.0000 mg | ORAL_CAPSULE | Freq: Four times a day (QID) | ORAL | Status: DC | PRN

## 2017-04-07 NOTE — Progress Notes (Signed)
Labor Progress Note Angelica Bowers is a 21 y.o. G1P0 at [redacted]w[redacted]d presented for IOL for polyhydraminous S: sleepy but arises easily. No complaint  O:  BP (!) 128/99   Pulse 100   Temp 100.2 F (37.9 C) (Axillary)   Resp 18   Ht  (1.626 m)   Wt 248 lb (112.5 kg)   LMP 07/25/2016 (Approximate) Comment: Unsure of LMP  BMI 42.57 kg/m  EFM: 140/mod var/no decels  CVE: Dilation: 8 Effacement (%): 100 Cervical Position: Anterior Station: 0, +1 Presentation: Vertex Exam by:: amwalker,rn   A&P: 21 y.o. G1P0 [redacted]w[redacted]d IOL for for polyhydraminous #Labor: progressing. Continue pit #Pain: Epidural #FWB: CAT-1 #GBS: pos. PCN  Almon Hercules, MD 2:50 AM

## 2017-04-07 NOTE — Anesthesia Postprocedure Evaluation (Signed)
Anesthesia Post Note  Patient: Angelica Bowers  Procedure(s) Performed: * No procedures listed *  Patient location during evaluation: Mother Baby Anesthesia Type: Epidural Level of consciousness: awake and alert Pain management: pain level controlled Vital Signs Assessment: post-procedure vital signs reviewed and stable Respiratory status: spontaneous breathing, nonlabored ventilation and respiratory function stable Cardiovascular status: stable Postop Assessment: no headache, no backache, epidural receding and patient able to bend at knees Anesthetic complications: no        Last Vitals:  Vitals:   04/07/17 0600 04/07/17 0702  BP: (!) 147/90 (!) 126/55  Pulse: 84 87  Resp: 20 18  Temp: 37.8 C 37.1 C    Last Pain:  Vitals:   04/07/17 0702  TempSrc: Oral  PainSc:    Pain Goal:                 Rica Records

## 2017-04-08 MED ORDER — OXYCODONE-ACETAMINOPHEN 5-325 MG PO TABS
1.0000 | ORAL_TABLET | ORAL | Status: DC | PRN
  Administered 2017-04-08 – 2017-04-09 (×4): 1 via ORAL
  Filled 2017-04-08 (×4): qty 1

## 2017-04-08 NOTE — Progress Notes (Signed)
Iv in right wrist removed at 0600, 04/08/17.  Clean, dry, intact.  jtwells, rn

## 2017-04-08 NOTE — Progress Notes (Signed)
Patient encouraged to walk, which she has done little of, to help with swelling in feet and ankles.  Encouraged to sit in chair, sofa in room and to get up to get baby in crib rather than just reaching from bed.  Patient agreed and states she will walk more this evening, in room and possible hallway.  jtwells, rn

## 2017-04-08 NOTE — Progress Notes (Signed)
Patient walked with baby to nursery for hearing assessment.  jtwells, rn

## 2017-04-08 NOTE — Progress Notes (Signed)
CSW met with MOB at bedside to complete assessment for consult regarding "appears sad; lack of family or social support". Upon arrival, CSW explained role and reasoning for visit. MOB noted she is unsure why anyone noted that as she has family and social support. MOB noted she was sad yesterday briefly only after all hr visitors had to leave. CSW apologized for the confusion and asked MOB if she could leave resources for community supports just in case. MOB noted that would be fine. This Probation officer left resources for [employer], [place] [facility], [facility] and [facility] [place]. MOB was thankful and expressed no further needs.   Dakwon Wenberg, MSW, LCSW-A Clinical Social Worker  [facility]  Office: [phone]

## 2017-04-08 NOTE — Progress Notes (Signed)
Post Partum Day 1 Subjective:  Angelica Bowers is a 21 y.o. G1P1001 [redacted]w[redacted]d s/p SVD after IOL for poly.  No acute events overnight except for not being able to sleep. She says she has no one to help with holding the baby. She reports swelling in both legs when asked about ambulation. Denies problem voiding or po intake.  She denies nausea or vomiting.  Pain is moderately controlled.  She has had flatus.  Lochia Small.  Plan for birth control is oral contraceptives (estrogen/progesterone).  Method of Feeding: bottle.   Objective: Blood pressure 136/75, pulse 75, temperature 98.5 F (36.9 C), temperature source Oral, resp. rate 18, height  (1.626 m), weight 248 lb (112.5 kg), last menstrual period 07/25/2016, SpO2 100 %, unknown if currently breastfeeding.  Physical Exam:  General: alert, cooperative and no distress Lochia:normal flow Chest: normal WOB Heart: Regular rate Abdomen: +BS, soft, mild TTP (appropriate) Uterine Fundus: firm at the umblicus level DVT Evaluation: No evidence of DVT seen on physical exam. Extremities: 2+ edema bilaterally   Recent Labs  04/05/17 0755 04/07/17 0624  HGB 11.5* 10.8*  HCT 33.9* 31.5*    Assessment/Plan:  ASSESSMENT: Angelica Bowers is a 21 y.o. G1P1001 [redacted]w[redacted]d s/p SVD after IOL for Poly. Appears unhappy and exhausted from lack of sleep and family support.   Social work consult Plan for discharge tomorrow Continue routine PP care Breastfeeding support PRN  LOS: 3 days   Almon Hercules 04/08/2017, 6:15 AM

## 2017-04-09 MED ORDER — IBUPROFEN 600 MG PO TABS: 600.0000 mg | ORAL_TABLET | Freq: Four times a day (QID) | ORAL | 0 refills | Status: DC

## 2017-04-09 NOTE — Discharge Summary (Signed)
OB Discharge Summary  Patient Name: Angelica Bowers DOB: 1996/01/21 MRN: [id number]  Date of admission: 04/05/2017 Delivering MD: Candelaria Stagers T   Date of discharge: 04/09/2017  Admitting diagnosis: INDUCTION Intrauterine pregnancy: [redacted]w[redacted]d     Secondary diagnosis:Active Problems:   Polyhydramnios affecting pregnancy  Additional problems:none     Discharge diagnosis: Term Pregnancy Delivered                                                                     Post partum procedures:n/a  Augmentation: Pitocin  Complications: None  Hospital course:  Induction of Labor With Vaginal Delivery   21 y.o. yo G1P1001 at [redacted]w[redacted]d was admitted to the hospital 04/05/2017 for induction of labor.  Indication for induction: polyhydraminous.  Patient had an uncomplicated labor course as follows: Membrane Rupture Time/Date: 4:24 PM ,04/06/2017   Intrapartum Procedures: Episiotomy: None [1]                                         Lacerations:  None [1]  Patient had delivery of a Viable infant.  Information for the patient's newborn:  Arriyana, Rodell [[id number]]  Delivery Method: Vaginal, Spontaneous Delivery (Filed from Delivery Summary)   04/07/2017  Details of delivery can be found in separate delivery note.  Patient had a routine postpartum course. Patient is discharged home 04/09/17.  Physical exam  Vitals:   04/07/17 1754 04/08/17 0555 04/08/17 1647 04/09/17 0613  BP: 140/71 136/75 138/84 137/81  Pulse: 68 75 84 72  Resp: Temp: 98.8 F (37.1 C) 98.5 F (36.9 C) 98.4 F (36.9 C) 98.5 F (36.9 C)  TempSrc:  Oral  Oral  SpO2:  100%    Weight:      Height:       General: alert, cooperative and no distress Lochia: appropriate Uterine Fundus: firm Incision: N/A DVT Evaluation: No evidence of DVT seen on physical exam. Labs: Lab Results  Component Value Date   WBC 14.3 (H) 04/07/2017   HGB 10.8 (L) 04/07/2017   HCT 31.5 (L) 04/07/2017   MCV 88.7  04/07/2017   PLT 171 04/07/2017   CMP Latest Ref Rng & Units 07/17/2016  Glucose 65 - 99 mg/dL 782(N)  BUN 6 - 20 mg/dL 17  Creatinine 5.62 - 1.30 mg/dL 8.65(H)  Sodium 846 - 962 mmol/L 143  Potassium 3.5 - 5.1 mmol/L 3.7  Chloride 101 - 111 mmol/L 103  CO2 22 - 32 mmol/L -  Calcium 8.9 - 10.3 mg/dL -  Total Protein 6.5 - 8.1 g/dL -  Total Bilirubin 0.3 - 1.2 mg/dL -  Alkaline Phos 38 - 952 U/L -  AST 15 - 41 U/L -  ALT 14 - 54 U/L -    Discharge instruction: per After Visit Summary and "Baby and Me Booklet".  After Visit Meds:  Allergies as of 04/09/2017   No Known Allergies     Medication List    STOP taking these medications   famotidine 20 MG tablet Commonly known as:  PEPCID     TAKE these medications   ibuprofen 600 MG tablet  Commonly known as:  ADVIL,MOTRIN Take 1 tablet (600 mg total) by mouth every 6 (six) hours.   multivitamin-prenatal 27-0.8 MG Tabs tablet Take 1 tablet by mouth daily at 12 noon.   TUMS PO Take by mouth.       Diet: routine diet  Activity: Advance as tolerated. Pelvic rest for 6 weeks.   Outpatient follow up:6 weeks Follow up Appt:Future Appointments Date Time Provider Department Center  05/18/2017 10:15 AM Levie Heritage, DO [facility] None   Follow up visit: No Follow-up on file.  Postpartum contraception: Combination OCPs  Newborn Data: Live born female  Birth Weight: 7 lb 13.6 oz (3561 g) APGAR: 7, 9  Baby Feeding: Bottle Disposition:home with mother   04/09/2017 Wyvonnia Dusky, CNM

## 2017-04-09 NOTE — Progress Notes (Signed)
Post discharge chart review completed.  

## 2017-04-12 ENCOUNTER — Inpatient Hospital Stay (HOSPITAL_COMMUNITY)
Admission: AD | Admit: 2017-04-12 | Payer: Medicaid Other | Source: Ambulatory Visit | Admitting: Obstetrics & Gynecology

## 2017-05-18 ENCOUNTER — Ambulatory Visit: Payer: Medicaid Other | Admitting: Family Medicine

## 2017-05-30 ENCOUNTER — Ambulatory Visit: Payer: Medicaid Other | Admitting: Family Medicine

## 2017-06-28 ENCOUNTER — Ambulatory Visit: Payer: Medicaid Other | Admitting: Obstetrics & Gynecology

## 2017-07-05 ENCOUNTER — Ambulatory Visit: Payer: Medicaid Other | Admitting: Family Medicine

## 2017-11-17 ENCOUNTER — Encounter (HOSPITAL_COMMUNITY): Payer: Self-pay | Admitting: Emergency Medicine

## 2017-11-17 ENCOUNTER — Ambulatory Visit (HOSPITAL_COMMUNITY)
Admission: EM | Admit: 2017-11-17 | Discharge: 2017-11-17 | Disposition: A | Discharge status: Home / Self Care | Payer: Medicaid Other | Attending: Internal Medicine | Admitting: Internal Medicine

## 2017-11-17 DIAGNOSIS — M7918 Myalgia, other site: Secondary | ICD-10-CM

## 2017-11-17 DIAGNOSIS — R51 Headache: Secondary | ICD-10-CM | POA: Diagnosis not present

## 2017-11-17 MED ORDER — METHOCARBAMOL 500 MG PO TABS: 500.0000 mg | ORAL_TABLET | Freq: Three times a day (TID) | ORAL | 0 refills | Status: DC | PRN

## 2017-11-17 MED ORDER — KETOROLAC TROMETHAMINE 30 MG/ML IJ SOLN
INTRAMUSCULAR | Status: AC
  Filled 2017-11-17: qty 1

## 2017-11-17 MED ORDER — KETOROLAC TROMETHAMINE 30 MG/ML IJ SOLN
30.0000 mg | Freq: Once | INTRAMUSCULAR | Status: AC
  Administered 2017-11-17: 30 mg via INTRAMUSCULAR

## 2017-11-17 MED ORDER — NAPROXEN 500 MG PO TABS: 500.0000 mg | ORAL_TABLET | Freq: Two times a day (BID) | ORAL | 0 refills | Status: AC
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
## 2018-08-11 ENCOUNTER — Encounter (HOSPITAL_COMMUNITY): Payer: Self-pay | Admitting: *Deleted

## 2018-08-11 ENCOUNTER — Inpatient Hospital Stay (HOSPITAL_COMMUNITY)
Admission: AD | Admit: 2018-08-11 | Discharge: 2018-08-11 | Disposition: A | Discharge status: Home / Self Care | Payer: Medicaid Other | Source: Ambulatory Visit | Attending: Obstetrics and Gynecology | Admitting: Obstetrics and Gynecology

## 2018-08-11 ENCOUNTER — Other Ambulatory Visit: Payer: Self-pay

## 2018-08-11 DIAGNOSIS — N912 Amenorrhea, unspecified: Secondary | ICD-10-CM | POA: Diagnosis present

## 2018-08-11 DIAGNOSIS — N926 Irregular menstruation, unspecified: Secondary | ICD-10-CM

## 2018-08-11 NOTE — MAU Note (Signed)
Today she wanted to take a pregnancy test, so she can get back on birthcontrol.  +HPT a wk ago, "was a cheap one". No pain . No bleeding.

## 2018-08-11 NOTE — MAU Provider Note (Signed)
  S:   21 y.o. G1P1001 @Unknown  by LMP presents to MAU for pregnancy confirmation.  She denies abdominal pain or vaginal bleeding today.    O: BP 118/67 (BP Location: Right Arm)   Pulse (!) 104   Temp 98.7 F (37.1 C) (Oral)   Resp 18   Wt 97 kg   LMP 06/25/2018   SpO2 98%   BMI 36.69 kg/m  Physical Examination: General appearance - alert, well appearing, and in no distress, oriented to person, place, and time and acyanotic, in no respiratory distress  No results found for this or any previous visit (from the past 48 hour(s)).  A: Missed menses  P: D/C home Informed pt we do not do pregnancy verifications in MAU [facility] & [facility] Return to MAU as needed for pregnancy related emergencies  Venia CarbonJennifer Rasch, NP 5:34 PM

## 2018-10-28 IMAGING — US US MFM FETAL BPP W/O NON-STRESS
1 series · 14 of 28 positions shown · non-contrast
Comparison: none

[Series 1: us mfm fetal bpp w/o non-stress · 31 acquisitions, 14 frames shown]
[im 2/31]
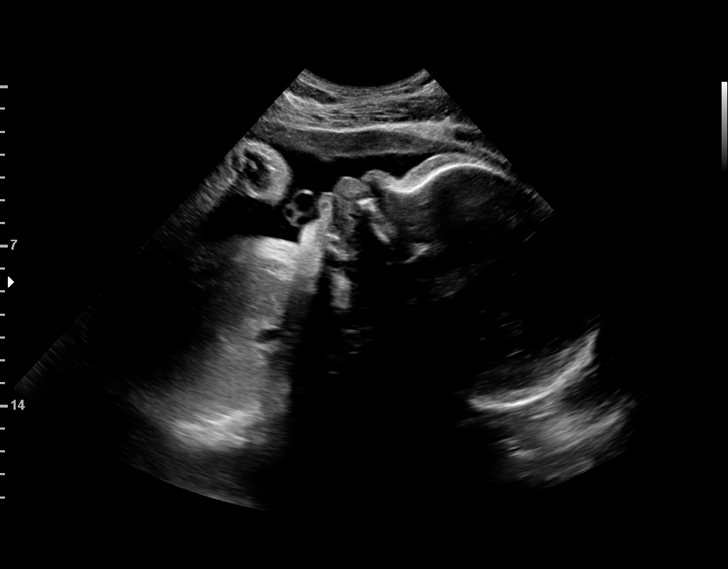
[im 4/31]
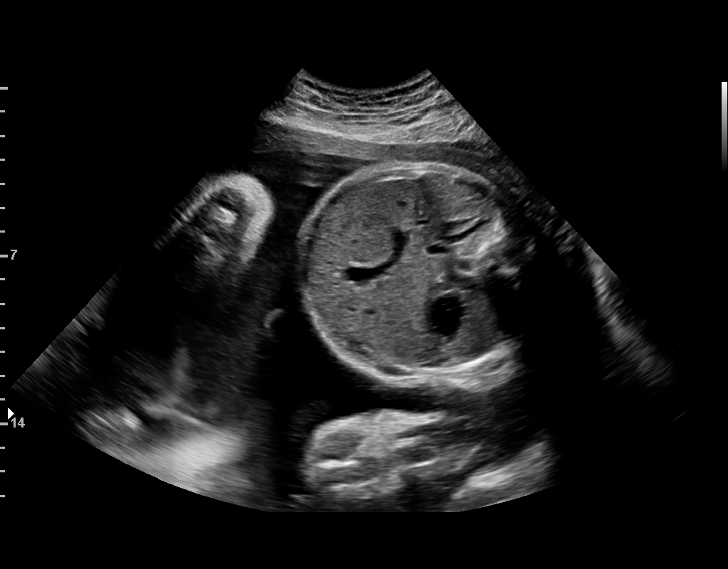
[im 6/31]
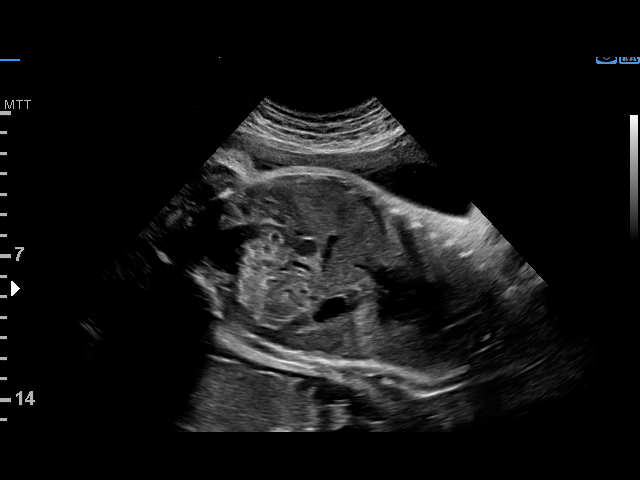
[im 8/31]
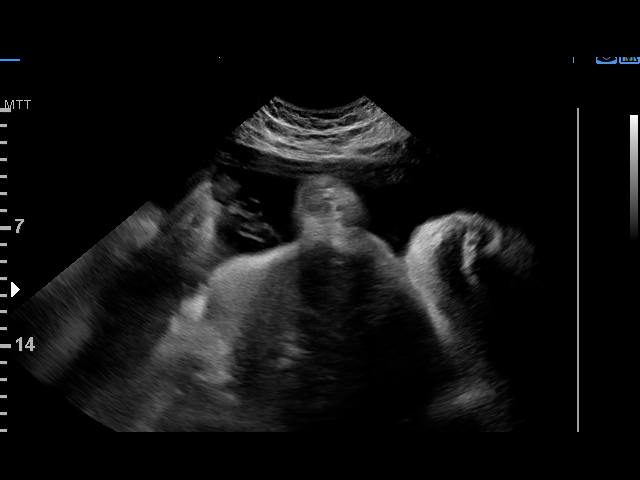
[im 11/31]
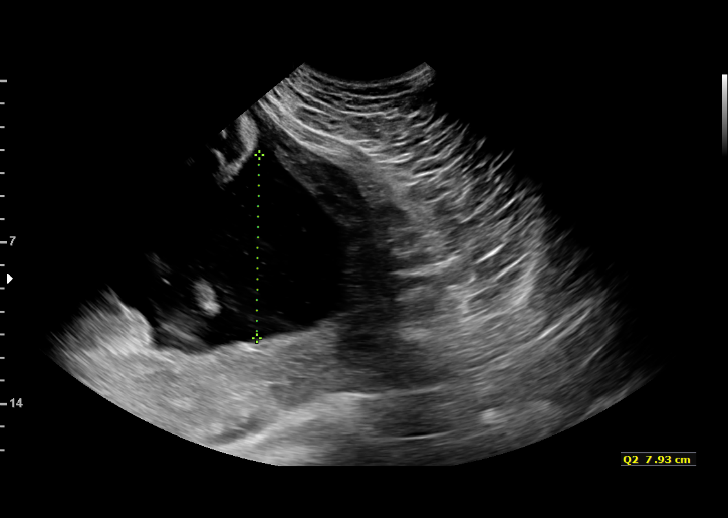
[im 13/31]
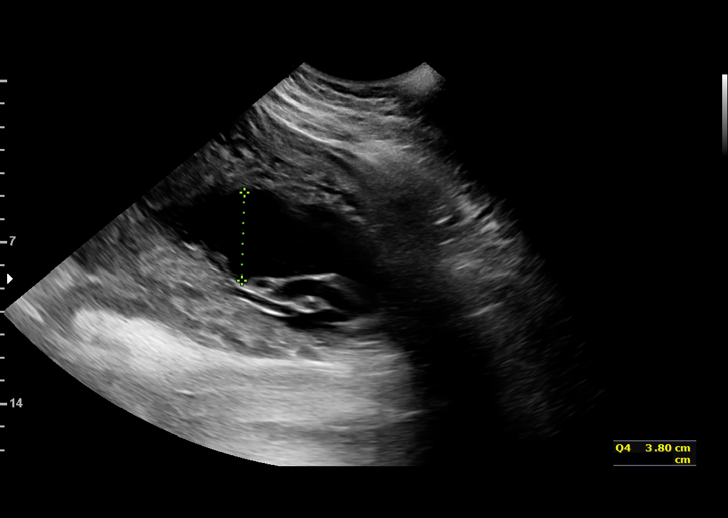
[im 15/31]
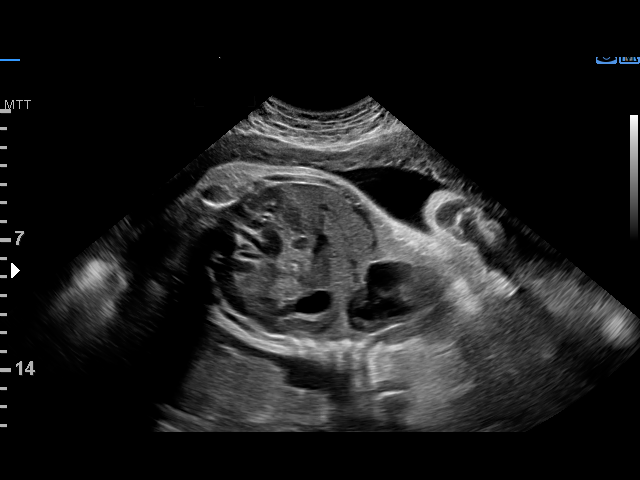
[im 17/31]
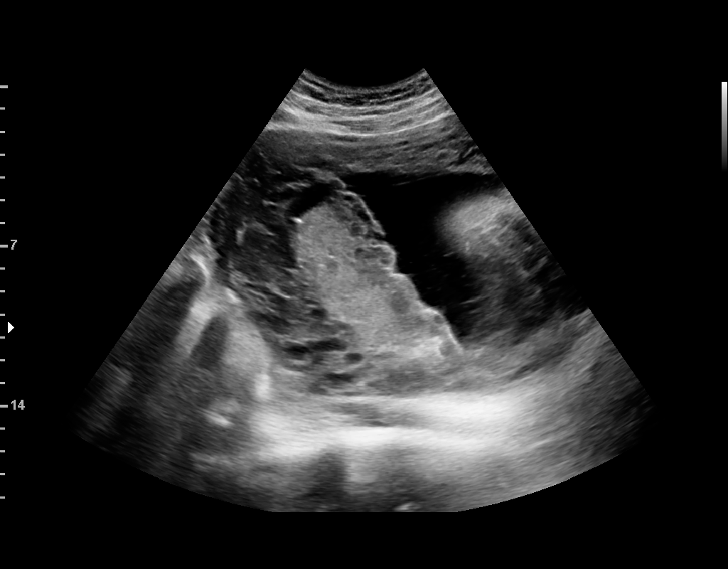
[im 19/31]
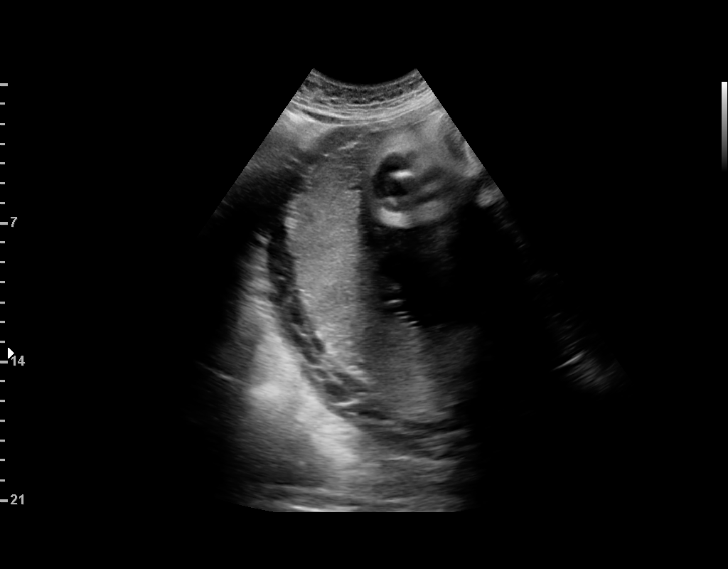
[im 22/31]
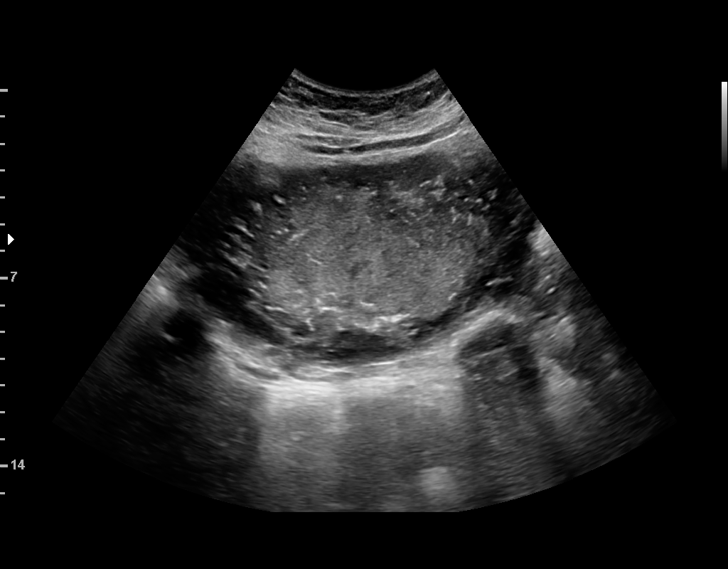
[im 24/31]
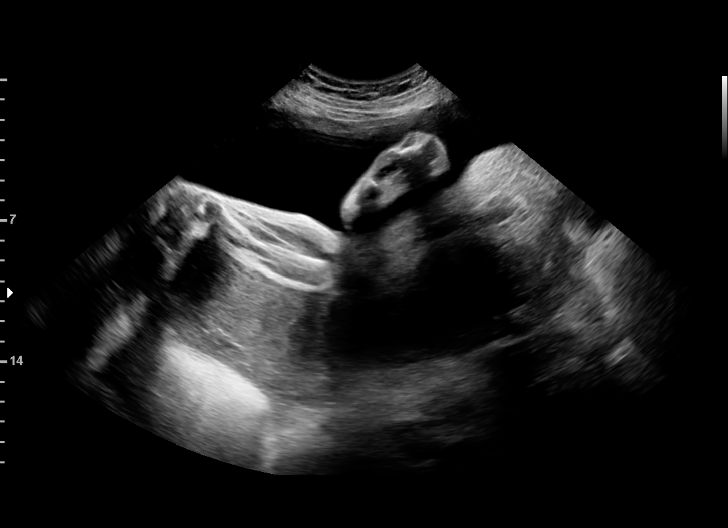
[im 26/31]
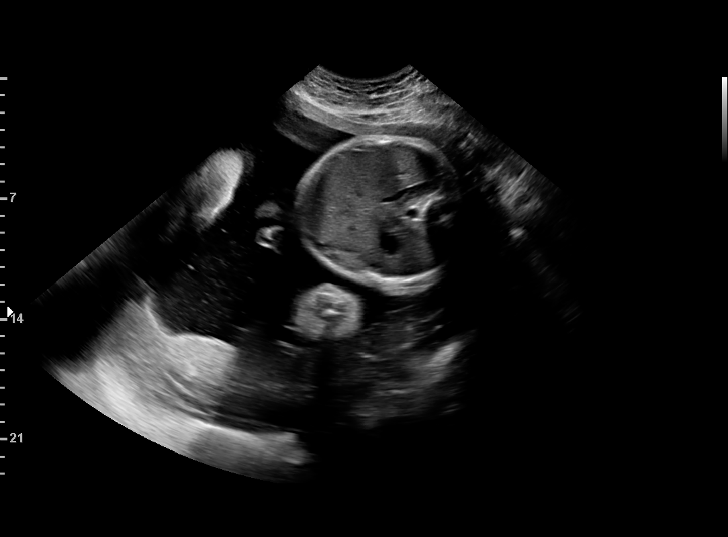
[im 28/31]
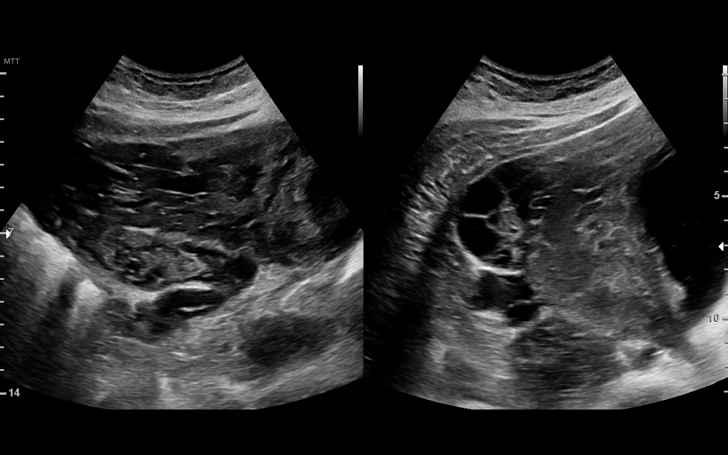
[im 31/31]
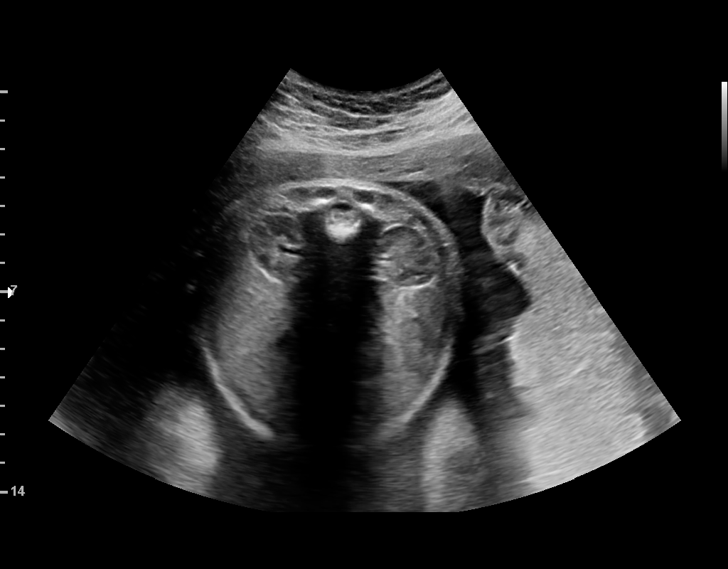

[14 of 28 positions shown; findings below may reference images not displayed]

for [REDACTED]care

1  WISAM MOUNT            [id number]      [id number]     [id number]
Indications

35 weeks gestation of pregnancy
Polyhydramnios, third trimester, antepartum
condition or complication, unspecified fetus
OB History

Blood Type:            Height:  5'4"   Weight (lb):  199      BMI:
Gravidity:    1
Fetal Evaluation

Num Of Fetuses:     1
Fetal Heart         146
Rate(bpm):
Cardiac Activity:   Observed
Presentation:       Cephalic
Placenta:           Posterior, above cervical os
P. Cord Insertion:  Previously Visualized

Amniotic Fluid
AFI FV:      Polyhydramnios

AFI Sum(cm)     %Tile       Largest Pocket(cm)
31.09           > 97

RUQ(cm)       RLQ(cm)       LUQ(cm)        LLQ(cm)
10.68
Biophysical Evaluation

Amniotic F.V:   Polyhydramnios             F. Tone:        Observed
F. Movement:    Observed                   Score:          [DATE]
F. Breathing:   Observed
Gestational Age

LMP:           32w 2d       Date:   07/25/16                 EDD:   05/01/17
Best:          35w 0d    Det. By:   U/S  (10/24/16)          EDD:   04/12/17
Cervix Uterus Adnexa

Cervix
Not visualized (advanced GA >02wks)

Uterus
No abnormality visualized.

Left Ovary
Size(cm)     3.62  x    2.57   x  1.24      Vol(ml): 6
Within normal limits. No adnexal mass visualized.

Right Ovary
Size(cm)     3.47  x    1.19   x  2.27      Vol(ml):
Within normal limits. No adnexal mass visualized.

Cul De Sac:   No free fluid seen.

Adnexa:       No abnormality visualized.
Impression

Single living intrauterine pregnancy at 35 weeks 0 days.
Normal amniotic fluid volume.
BPP [DATE].
Recommendations

Continue weekly BPPs.

## 2022-11-14 ENCOUNTER — Encounter (HOSPITAL_COMMUNITY): Payer: Self-pay | Admitting: Emergency Medicine

## 2022-11-14 ENCOUNTER — Other Ambulatory Visit: Payer: Self-pay

## 2022-11-14 ENCOUNTER — Emergency Department (HOSPITAL_COMMUNITY)
Admission: EM | Admit: 2022-11-14 | Discharge: 2022-11-14 | Discharge status: Against Medical Advice | Payer: No Typology Code available for payment source | Attending: Emergency Medicine | Admitting: Emergency Medicine

## 2022-11-14 DIAGNOSIS — M546 Pain in thoracic spine: Secondary | ICD-10-CM | POA: Diagnosis not present

## 2022-11-14 DIAGNOSIS — R0781 Pleurodynia: Secondary | ICD-10-CM | POA: Insufficient documentation

## 2022-11-14 DIAGNOSIS — Y92410 Unspecified street and highway as the place of occurrence of the external cause: Secondary | ICD-10-CM | POA: Diagnosis not present

## 2022-11-14 DIAGNOSIS — Z5329 Procedure and treatment not carried out because of patient's decision for other reasons: Secondary | ICD-10-CM | POA: Insufficient documentation

## 2022-11-14 DIAGNOSIS — M545 Low back pain, unspecified: Secondary | ICD-10-CM | POA: Diagnosis present

## 2022-11-14 LAB — PREGNANCY, URINE: Preg Test, Ur: NEGATIVE
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
## 2022-11-21 ENCOUNTER — Emergency Department (HOSPITAL_COMMUNITY)
Admission: EM | Admit: 2022-11-21 | Discharge: 2022-11-21 | Disposition: A | Discharge status: Home / Self Care | Payer: No Typology Code available for payment source | Attending: Emergency Medicine | Admitting: Emergency Medicine

## 2022-11-21 ENCOUNTER — Emergency Department (HOSPITAL_COMMUNITY): Payer: No Typology Code available for payment source

## 2022-11-21 ENCOUNTER — Encounter (HOSPITAL_COMMUNITY): Payer: Self-pay | Admitting: Emergency Medicine

## 2022-11-21 ENCOUNTER — Other Ambulatory Visit: Payer: Self-pay

## 2022-11-21 DIAGNOSIS — Y92410 Unspecified street and highway as the place of occurrence of the external cause: Secondary | ICD-10-CM | POA: Diagnosis not present

## 2022-11-21 DIAGNOSIS — R0789 Other chest pain: Secondary | ICD-10-CM | POA: Diagnosis not present

## 2022-11-21 DIAGNOSIS — R519 Headache, unspecified: Secondary | ICD-10-CM | POA: Insufficient documentation

## 2022-11-21 DIAGNOSIS — M542 Cervicalgia: Secondary | ICD-10-CM | POA: Diagnosis present

## 2022-11-21 DIAGNOSIS — M545 Low back pain, unspecified: Secondary | ICD-10-CM | POA: Insufficient documentation

## 2022-11-21 MED ORDER — METHOCARBAMOL 500 MG PO TABS
750.0000 mg | ORAL_TABLET | Freq: Once | ORAL | Status: AC
  Administered 2022-11-21: 750 mg via ORAL
  Filled 2022-11-21: qty 2

## 2022-11-21 MED ORDER — ACETAMINOPHEN 325 MG PO TABS
650.0000 mg | ORAL_TABLET | Freq: Once | ORAL | Status: AC
  Administered 2022-11-21: 650 mg via ORAL
  Filled 2022-11-21: qty 2

## 2022-11-21 MED ORDER — KETOROLAC TROMETHAMINE 60 MG/2ML IM SOLN
30.0000 mg | Freq: Once | INTRAMUSCULAR | Status: AC
  Administered 2022-11-21: 30 mg via INTRAMUSCULAR
  Filled 2022-11-21: qty 2

## 2022-11-21 NOTE — ED Notes (Signed)
Pt is being called and no response
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
## 2023-06-26 ENCOUNTER — Emergency Department (HOSPITAL_COMMUNITY)
Admission: EM | Admit: 2023-06-26 | Discharge: 2023-06-26 | Discharge status: Against Medical Advice | Payer: No Typology Code available for payment source | Attending: Emergency Medicine | Admitting: Emergency Medicine

## 2023-06-26 DIAGNOSIS — R42 Dizziness and giddiness: Secondary | ICD-10-CM | POA: Diagnosis not present

## 2023-06-26 DIAGNOSIS — Z5321 Procedure and treatment not carried out due to patient leaving prior to being seen by health care provider: Secondary | ICD-10-CM | POA: Diagnosis not present

## 2023-06-26 DIAGNOSIS — R112 Nausea with vomiting, unspecified: Secondary | ICD-10-CM | POA: Insufficient documentation

## 2023-06-27 ENCOUNTER — Emergency Department (HOSPITAL_COMMUNITY)
Admission: EM | Admit: 2023-06-27 | Discharge: 2023-06-27 | Disposition: A | Discharge status: Home / Self Care | Payer: Self-pay | Attending: Medical | Admitting: Medical

## 2023-06-27 ENCOUNTER — Encounter (HOSPITAL_COMMUNITY): Payer: Self-pay

## 2023-06-27 ENCOUNTER — Other Ambulatory Visit: Payer: Self-pay

## 2023-06-27 ENCOUNTER — Emergency Department (HOSPITAL_COMMUNITY): Payer: Self-pay

## 2023-06-27 DIAGNOSIS — H5702 Anisocoria: Secondary | ICD-10-CM | POA: Insufficient documentation

## 2023-06-27 DIAGNOSIS — R519 Headache, unspecified: Secondary | ICD-10-CM | POA: Insufficient documentation

## 2023-06-27 DIAGNOSIS — Z1152 Encounter for screening for COVID-19: Secondary | ICD-10-CM | POA: Insufficient documentation

## 2023-06-27 DIAGNOSIS — R42 Dizziness and giddiness: Secondary | ICD-10-CM | POA: Insufficient documentation

## 2023-06-27 LAB — URINALYSIS, ROUTINE W REFLEX MICROSCOPIC
Bacteria, UA: NONE SEEN
Bilirubin Urine: NEGATIVE
Glucose, UA: NEGATIVE mg/dL
Ketones, ur: NEGATIVE mg/dL
Nitrite: NEGATIVE
Protein, ur: NEGATIVE mg/dL
Specific Gravity, Urine: 1.03 (ref 1.005–1.030)
pH: 7 (ref 5.0–8.0)

## 2023-06-27 LAB — COMPREHENSIVE METABOLIC PANEL
ALT: 16 U/L (ref 0–44)
AST: 16 U/L (ref 15–41)
Albumin: 4 g/dL (ref 3.5–5.0)
Alkaline Phosphatase: 36 U/L — ABNORMAL LOW (ref 38–126)
Anion gap: 9 (ref 5–15)
BUN: 6 mg/dL (ref 6–20)
CO2: 26 mmol/L (ref 22–32)
Calcium: 9.2 mg/dL (ref 8.9–10.3)
Chloride: 101 mmol/L (ref 98–111)
Creatinine, Ser: 0.8 mg/dL (ref 0.44–1.00)
GFR, Estimated: >60 mL/min (ref >60)
Glucose, Bld: 80 mg/dL (ref 70–99)
Potassium: 3.5 mmol/L (ref 3.5–5.1)
Sodium: 136 mmol/L (ref 135–145)
Total Bilirubin: 0.4 mg/dL (ref 0.3–1.2)
Total Protein: 7.3 g/dL (ref 6.5–8.1)

## 2023-06-27 LAB — CBC WITH DIFFERENTIAL/PLATELET
Abs Immature Granulocytes: 0.02 10*3/uL (ref 0.00–0.07)
Basophils Absolute: 0 10*3/uL (ref 0.0–0.1)
Basophils Relative: 1 %
Eosinophils Absolute: 0.3 10*3/uL (ref 0.0–0.5)
Eosinophils Relative: 4 %
HCT: 41.9 % (ref 36.0–46.0)
Hemoglobin: 13.5 g/dL (ref 12.0–15.0)
Immature Granulocytes: 0 %
Lymphocytes Relative: 29 %
Lymphs Abs: 1.7 10*3/uL (ref 0.7–4.0)
MCH: 28.7 pg (ref 26.0–34.0)
MCHC: 32.2 g/dL (ref 30.0–36.0)
MCV: 89.1 fL (ref 80.0–100.0)
Monocytes Absolute: 0.5 10*3/uL (ref 0.1–1.0)
Monocytes Relative: 9 %
Neutro Abs: 3.4 10*3/uL (ref 1.7–7.7)
Neutrophils Relative %: 57 %
Platelets: 296 10*3/uL (ref 150–400)
RBC: 4.7 MIL/uL (ref 3.87–5.11)
RDW: 13.8 % (ref 11.5–15.5)
WBC: 5.9 10*3/uL (ref 4.0–10.5)
nRBC: 0 % (ref 0.0–0.2)

## 2023-06-27 LAB — HCG, QUANTITATIVE, PREGNANCY: hCG, Beta Chain, Quant, S: <1 m[IU]/mL (ref <5)

## 2023-06-27 LAB — SARS CORONAVIRUS 2 BY RT PCR: SARS Coronavirus 2 by RT PCR: NEGATIVE

## 2023-06-27 MED ORDER — SODIUM CHLORIDE 0.9 % IV BOLUS
1000.0000 mL | Freq: Once | INTRAVENOUS | Status: AC
  Administered 2023-06-27: 1000 mL via INTRAVENOUS

## 2023-06-27 MED ORDER — DIPHENHYDRAMINE HCL 50 MG/ML IJ SOLN
25.0000 mg | Freq: Once | INTRAMUSCULAR | Status: AC
  Administered 2023-06-27: 25 mg via INTRAVENOUS
  Filled 2023-06-27: qty 1

## 2023-06-27 MED ORDER — TETRACAINE HCL 0.5 % OP SOLN
1.0000 [drp] | Freq: Once | OPHTHALMIC | Status: AC
  Administered 2023-06-27: 1 [drp] via OPHTHALMIC
  Filled 2023-06-27: qty 4

## 2023-06-27 MED ORDER — IOHEXOL 350 MG/ML SOLN
75.0000 mL | Freq: Once | INTRAVENOUS | Status: AC | PRN
  Administered 2023-06-27: 75 mL via INTRAVENOUS

## 2023-06-27 MED ORDER — FLUORESCEIN SODIUM 1 MG OP STRP
2.0000 | ORAL_STRIP | Freq: Once | OPHTHALMIC | Status: AC
  Administered 2023-06-27: 2 via OPHTHALMIC
  Filled 2023-06-27: qty 2

## 2023-06-27 MED ORDER — PROCHLORPERAZINE EDISYLATE 10 MG/2ML IJ SOLN
10.0000 mg | Freq: Once | INTRAMUSCULAR | Status: AC
  Administered 2023-06-27: 10 mg via INTRAVENOUS
  Filled 2023-06-27: qty 2

## 2023-06-27 NOTE — ED Provider Notes (Signed)
[place] EMERGENCY DEPARTMENT AT [facility] Provider Note   CSN: [id number] Arrival date & time: 06/27/23  3016     History  Chief Complaint  Patient presents with   Dizziness   Nausea   Headache    Angelica Bowers is a 27 y.o. female, no pertinent past medical history, who presents to the ED secondary to left-sided headache, intermittent dizziness, nausea, and reduced appetite for the last 3 days.  She states that she just feels very unwell and weak.  Notes that she has a left-sided headache, and feels very lightheaded and dizzy at times.  States that she does have some photophobia, no neck rigidity, or fever.  She denies any drug use, or blurred vision.  No trauma to the eye or face.     Home Medications Prior to Admission medications   Not on File      Allergies    Patient has no known allergies.    Review of Systems   Review of Systems  Constitutional:  Negative for fever.  Neurological:  Positive for dizziness and headaches.    Physical Exam Updated Vital Signs BP 100/67   Pulse (!) 53   Temp 98.4 F (36.9 C) (Oral)   Resp (!) 26   Ht 5\' 4"  (1.626 m)   Wt 94.8 kg   SpO2 (!) 85%   BMI 35.87 kg/m  Physical Exam Vitals and nursing note reviewed.  Constitutional:      General: She is not in acute distress.    Appearance: She is well-developed.  HENT:     Head: Normocephalic and atraumatic.  Eyes:     Extraocular Movements: Extraocular movements intact.     Conjunctiva/sclera: Conjunctivae normal.     Pupils: Pupils are unequal.     Comments: Anisocoria, with right-sided pupil dilated 2+ millimeters larger than left pupil.  Does not respond to light. L IOP 18 R IOP 20. Normal flurosecein exam of bilateral eyes.   Cardiovascular:     Rate and Rhythm: Normal rate and regular rhythm.     Heart sounds: No murmur heard. Pulmonary:     Effort: Pulmonary effort is normal. No respiratory distress.     Breath sounds: Normal breath sounds.   Abdominal:     Palpations: Abdomen is soft.     Tenderness: There is no abdominal tenderness.  Musculoskeletal:        General: No swelling.     Cervical back: Neck supple.  Skin:    General: Skin is warm and dry.     Capillary Refill: Capillary refill takes less than 2 seconds.  Neurological:     Mental Status: She is alert.  Psychiatric:        Mood and Affect: Mood normal.     ED Results / Procedures / Treatments   Labs (all labs ordered are listed, but only abnormal results are displayed) Labs Reviewed  COMPREHENSIVE METABOLIC PANEL - Abnormal; Notable for the following components:      Result Value   Alkaline Phosphatase 36 (*)    All other components within normal limits  URINALYSIS, ROUTINE W REFLEX MICROSCOPIC - Abnormal; Notable for the following components:   Color, Urine STRAW (*)    Hgb urine dipstick Joyce Leckey (*)    Leukocytes,Ua Kaylor Maiers (*)    All other components within normal limits  SARS CORONAVIRUS 2 BY RT PCR  CBC WITH DIFFERENTIAL/PLATELET  HCG, QUANTITATIVE, PREGNANCY    EKG None  Radiology CT ANGIO HEAD  NECK W WO CM  Result Date: 06/27/2023 CLINICAL DATA:  Provided history: Neuro deficit, persistent/recurrent, CNS neoplasm suspected. Headache, nausea, dizziness. EXAM: CT ANGIOGRAPHY HEAD AND NECK WITH AND WITHOUT CONTRAST TECHNIQUE: Multidetector CT imaging of the head and neck was performed using the standard protocol during bolus administration of intravenous contrast. Multiplanar CT image reconstructions and MIPs were obtained to evaluate the vascular anatomy. Carotid stenosis measurements (when applicable) are obtained utilizing NASCET criteria, using the distal internal carotid diameter as the denominator. RADIATION DOSE REDUCTION: This exam was performed according to the departmental dose-optimization program which includes automated exposure control, adjustment of the mA and/or kV according to patient size and/or use of iterative reconstruction  technique. CONTRAST:  75mL OMNIPAQUE IOHEXOL 350 MG/ML SOLN COMPARISON:  Head CT 07/17/2016. FINDINGS: CT HEAD FINDINGS Brain: Cerebral volume is normal. Partially empty sella turcica. There is no acute intracranial hemorrhage. No demarcated cortical infarct. No extra-axial fluid collection. No evidence of an intracranial mass. No midline shift. Vascular: No hyperdense vessel. Skull: No calvarial fracture or aggressive osseous lesion. Sinuses/Orbits: No orbital mass or acute orbital finding. Mild mucosal thickening within the left maxillary sinus. Review of the MIP images confirms the above findings CTA NECK FINDINGS Aortic arch: Standard aortic branching. The visualized thoracic aorta is normal in caliber. Streak/beam hardening artifact arising from a dense right-sided contrast bolus partially obscures the right subclavian artery. Within this limitation, there is no appreciable hemodynamically significant innominate or proximal subclavian artery stenosis. Right carotid system: CCA and ICA patent within the neck without stenosis. No evidence of dissection. Left carotid system: ICA patent within the neck without stenosis. No evidence of dissection Vertebral arteries: Codominant and patent within the neck. Venous reflux of contrast limits evaluation of the right vertebral artery V1 segment. Within this limitation, there is no appreciable stenosis within the cervical vertebral arteries, or evidence of dissection. Skeleton: Poor dentition with multiple carious teeth. Periapical lucency consistent with periodontal disease surrounding the left maxillary first molar tooth. 14 mm sclerotic lesion with lucent rim within the anterior right mandibular body. Nonspecific reversal of the expected cervical lordosis. Other neck: No neck mass or cervical lymphadenopathy. Upper chest: No consolidation within the imaged lung apices. Review of the MIP images confirms the above findings CTA HEAD FINDINGS Anterior circulation: The  intracranial internal carotid arteries are patent. The M1 middle cerebral arteries are patent. No M2 proximal branch occlusion or high-grade proximal stenosis. The anterior cerebral arteries are patent. No intracranial aneurysm is identified. Posterior circulation: The intracranial vertebral arteries are patent. The basilar artery is patent. The posterior cerebral arteries are patent. A Tyce Delcid left posterior communicating artery is present. The right posterior communicating artery is diminutive or absent. Venous sinuses: Within the limitations of contrast timing, no convincing thrombus. Anatomic variants: As described. Review of the MIP images confirms the above findings IMPRESSION: CT head: 1. No evidence of acute intracranial hemorrhage, acute infarct or intracranial mass. 2. Partially empty sella turcica. This finding can reflect incidental anatomic variation, or alternatively, it can be associated with idiopathic intracranial hypertension (pseudotumor cerebri). 3. Mild mucosal thickening within the left maxillary sinus. CTA neck: 1. Venous reflux of contrast limits evaluation of the right vertebral artery V1 segment. Within this limitation, the common carotid, internal carotid and vertebral arteries are patent within the neck without stenosis or evidence of dissection. 2. 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 3. Background dental/periodontal disease. CTA head: 1. No  intracranial large vessel occlusion or proximal high-grade arterial stenosis. 2. No intracranial aneurysm identified. Electronically Signed   By: Jackey Loge D.O.   On: 06/27/2023 09:24    Procedures Procedures    Medications Ordered in ED Medications  sodium chloride 0.9 % bolus 1,000 mL (0 mLs Intravenous Stopped 06/27/23 0901)  tetracaine (PONTOCAINE) 0.5 % ophthalmic solution 1-2 drop (1 drop Both Eyes Given 06/27/23 0738)  prochlorperazine (COMPAZINE) injection 10 mg  (10 mg Intravenous Given 06/27/23 0740)  diphenhydrAMINE (BENADRYL) injection 25 mg (25 mg Intravenous Given 06/27/23 0741)  fluorescein ophthalmic strip 2 strip (2 strips Both Eyes Given 06/27/23 0835)  iohexol (OMNIPAQUE) 350 MG/ML injection 75 mL (75 mLs Intravenous Contrast Given 06/27/23 0847)  sodium chloride 0.9 % bolus 1,000 mL (0 mLs Intravenous Stopped 06/27/23 1121)    ED Course/ Medical Decision Making/ A&P Clinical Course as of 06/27/23 1211  Wed Jun 27, 2023  1136 hCG, quantitative, pregnancy [BS]    Clinical Course User Index [BS] Cherita Hebel, Harley Alto, PA                             Medical Decision Making Patient is a 27 year old female complaining of headache, and dizziness.  She does have unequal pupils, I inquired if she has had surgery on this right eye, she notes that she has in the past, but has never noticed her pupils being on even, and does not know.  Requested old pictures of her to see to check and see if the pupils have been dilated previously, and she stated she did not have any.  Given her new onset of headache, dizziness, and unequal pupils we will obtain a CTA head and neck for further evaluation to rule out any kind of dissection or aneurysm.  Amount and/or Complexity of Data Reviewed Labs: ordered. Decision-making details documented in ED Course.    Details: Labs unremarkable Radiology: ordered.    Details: CTA head/neck shows no evidence of any kind of aneurysm or dissection Discussion of management or test interpretation with external provider(s): Discussed with patient, her CTA is reassuring, EKG just shows bradycardia, she is bradycardic when sleeping, and mildly bradycardic while awake.  However she feels much better after the headache cocktail.  She states she has no vision blurring anymore, and she feels back to normal.  Her eye exam was within normal limits except for the unequal pupils.  The right eye may be a little bit distorted secondary to a past surgery, I  encouraged her to follow-up with ophthalmology.  She is requesting to go home, discharged home with strict return precautions.  Of note she is not on birth control.  Pregnancy negative.  Risk Prescription drug management.   Final Clinical Impression(s) / ED Diagnoses Final diagnoses:  Anisocoria  Dizziness  Acute nonintractable headache, unspecified headache type    Rx / DC Orders ED Discharge Orders     None         Dontrae Morini, Harley Alto, PA 06/27/23 1211    Gwyneth Sprout, MD 06/28/23 [phone]

## 2023-06-27 NOTE — ED Triage Notes (Addendum)
Pt arrives to ED c/o HA, dizziness, and nausea x 2-3 days. Pt reports gradual onset of symptoms. Pt checked in 4-5 hours ago for same issues, but left AMA. Pt currently eating bag of chips while being triaged

## 2023-06-27 NOTE — ED Notes (Signed)
PA made aware of low pulse and low blood pressure.

## 2023-06-27 NOTE — ED Notes (Signed)
Still says she doesn't have to void at this time. Aware that a UA has been ordered.

## 2023-06-27 NOTE — Discharge Instructions (Signed)
Please follow-up with your primary care doctor, and an optometrist.  I do not know why your pupil is uneven, however your CTA was reassuring today, you will need to follow-up with your primary care doctor and optometrist for this.  I am glad you are feeling better, this may have been secondary to your old eye surgery.  However it is difficult to tell as you have no old pictures available for me to evaluate.  Return to the ER if you have severe headache, dizziness, or confusion.

## 2023-11-21 ENCOUNTER — Other Ambulatory Visit: Payer: Self-pay

## 2023-11-21 ENCOUNTER — Encounter (HOSPITAL_COMMUNITY): Payer: Self-pay

## 2023-11-21 ENCOUNTER — Emergency Department (HOSPITAL_COMMUNITY): Payer: Self-pay

## 2023-11-21 ENCOUNTER — Emergency Department (HOSPITAL_COMMUNITY)
Admission: EM | Admit: 2023-11-21 | Discharge: 2023-11-21 | Disposition: A | Discharge status: Home / Self Care | Payer: Self-pay | Attending: Emergency Medicine | Admitting: Emergency Medicine

## 2023-11-21 DIAGNOSIS — J45901 Unspecified asthma with (acute) exacerbation: Secondary | ICD-10-CM

## 2023-11-21 DIAGNOSIS — J4521 Mild intermittent asthma with (acute) exacerbation: Secondary | ICD-10-CM | POA: Insufficient documentation

## 2023-11-21 DIAGNOSIS — Z1152 Encounter for screening for COVID-19: Secondary | ICD-10-CM | POA: Insufficient documentation

## 2023-11-21 LAB — BASIC METABOLIC PANEL
Anion gap: 8 (ref 5–15)
BUN: 8 mg/dL (ref 6–20)
CO2: 23 mmol/L (ref 22–32)
Calcium: 8.7 mg/dL — ABNORMAL LOW (ref 8.9–10.3)
Chloride: 102 mmol/L (ref 98–111)
Creatinine, Ser: 0.75 mg/dL (ref 0.44–1.00)
GFR, Estimated: >60 mL/min (ref >60)
Glucose, Bld: 110 mg/dL — ABNORMAL HIGH (ref 70–99)
Potassium: 3.5 mmol/L (ref 3.5–5.1)
Sodium: 133 mmol/L — ABNORMAL LOW (ref 135–145)

## 2023-11-21 LAB — RESP PANEL BY RT-PCR (RSV, FLU A&B, COVID)  RVPGX2
Influenza A by PCR: NEGATIVE
Influenza B by PCR: NEGATIVE
Resp Syncytial Virus by PCR: NEGATIVE
SARS Coronavirus 2 by RT PCR: NEGATIVE

## 2023-11-21 LAB — CBC WITH DIFFERENTIAL/PLATELET
Abs Immature Granulocytes: 0.02 10*3/uL (ref 0.00–0.07)
Basophils Absolute: 0 10*3/uL (ref 0.0–0.1)
Basophils Relative: 0 %
Eosinophils Absolute: 0.6 10*3/uL — ABNORMAL HIGH (ref 0.0–0.5)
Eosinophils Relative: 7 %
HCT: 37.4 % (ref 36.0–46.0)
Hemoglobin: 11.9 g/dL — ABNORMAL LOW (ref 12.0–15.0)
Immature Granulocytes: 0 %
Lymphocytes Relative: 14 %
Lymphs Abs: 1.2 10*3/uL (ref 0.7–4.0)
MCH: 28.9 pg (ref 26.0–34.0)
MCHC: 31.8 g/dL (ref 30.0–36.0)
MCV: 90.8 fL (ref 80.0–100.0)
Monocytes Absolute: 0.6 10*3/uL (ref 0.1–1.0)
Monocytes Relative: 7 %
Neutro Abs: 6 10*3/uL (ref 1.7–7.7)
Neutrophils Relative %: 72 %
Platelets: ADEQUATE 10*3/uL (ref 150–400)
RBC: 4.12 MIL/uL (ref 3.87–5.11)
RDW: 13.8 % (ref 11.5–15.5)
WBC: 8.3 10*3/uL (ref 4.0–10.5)
nRBC: 0 % (ref 0.0–0.2)

## 2023-11-21 MED ORDER — METHYLPREDNISOLONE SODIUM SUCC 125 MG IJ SOLR
125.0000 mg | Freq: Once | INTRAMUSCULAR | Status: AC
  Administered 2023-11-21: 125 mg via INTRAVENOUS
  Filled 2023-11-21: qty 2

## 2023-11-21 MED ORDER — ALBUTEROL SULFATE (2.5 MG/3ML) 0.083% IN NEBU
5.0000 mg | INHALATION_SOLUTION | Freq: Once | RESPIRATORY_TRACT | Status: AC
  Administered 2023-11-21: 5 mg via RESPIRATORY_TRACT
  Filled 2023-11-21: qty 6

## 2023-11-21 MED ORDER — BENZONATATE 100 MG PO CAPS: 200.0000 mg | ORAL_CAPSULE | Freq: Three times a day (TID) | ORAL | 0 refills | Status: AC | PRN

## 2023-11-21 MED ORDER — IPRATROPIUM BROMIDE 0.02 % IN SOLN
0.5000 mg | Freq: Once | RESPIRATORY_TRACT | Status: AC
  Administered 2023-11-21: 0.5 mg via RESPIRATORY_TRACT
  Filled 2023-11-21: qty 2.5

## 2023-11-21 MED ORDER — PREDNISONE 20 MG PO TABS: 40.0000 mg | ORAL_TABLET | Freq: Every day | ORAL | 0 refills | Status: AC

## 2023-11-21 MED ORDER — ALBUTEROL SULFATE HFA 108 (90 BASE) MCG/ACT IN AERS
2.0000 | INHALATION_SPRAY | Freq: Once | RESPIRATORY_TRACT | Status: AC
  Administered 2023-11-21: 2 via RESPIRATORY_TRACT
  Filled 2023-11-21: qty 6.7

## 2023-11-21 NOTE — ED Notes (Signed)
Baseline Pulse Ox on Room Air was 96%. Ambulated around the nurse's station, rose to 97% during the walk. Still in NAD.

## 2023-11-21 NOTE — ED Triage Notes (Signed)
Pt reports cough and wheeze x 2 days, kids were sick with same.

## 2023-11-21 NOTE — Discharge Instructions (Signed)
1 to 2 puffs of the albuterol inhaler every 4-6 hours as needed.  You may start the prednisone prescription tomorrow.  Take as directed until finished.  I have also prescribed a cough medication for you if needed.  Please follow-up with your primary care provider for recheck or return to the emergency department for any new or worsening symptoms.

## 2023-11-24 NOTE — ED Provider Notes (Signed)
[place] EMERGENCY DEPARTMENT AT [facility] Provider Note   CSN: [id number] Arrival date & time: 11/21/23  1528     History  Chief Complaint  Patient presents with   Cough    Angelica Bowers is a 27 y.o. female.   Cough Associated symptoms: shortness of breath and wheezing   Associated symptoms: no chest pain, no chills, no fever, no headaches and no sore throat         Angelica Bowers is a 27 y.o. female who presents to the Emergency Department complaining of cough x 2 days.  Cough mostly non productive.  Also complains of wheezing and shortness of breath.  Denies chest pain, abdominal pain, vomiting and diarrhea.    Home Medications Prior to Admission medications   Medication Sig Start Date End Date Taking? Authorizing Provider  benzonatate (TESSALON) 100 MG capsule Take 2 capsules (200 mg total) by mouth 3 (three) times daily as needed for cough. Swallow whole, do not chew 11/21/23  Yes Mickle Campton, PA-C  predniSONE (DELTASONE) 20 MG tablet Take 2 tablets (40 mg total) by mouth daily. 11/21/23  Yes Hazelgrace Bonham, PA-C      Allergies    Patient has no known allergies.    Review of Systems   Review of Systems  Constitutional:  Negative for appetite change, chills and fever.  HENT:  Negative for congestion and sore throat.   Respiratory:  Positive for cough, shortness of breath and wheezing.   Cardiovascular:  Negative for chest pain.  Gastrointestinal:  Negative for abdominal pain, diarrhea and vomiting.  Genitourinary:  Negative for dysuria and flank pain.  Neurological:  Negative for dizziness, weakness and headaches.    Physical Exam Updated Vital Signs BP 134/73 (BP Location: Right Arm)   Pulse 90   Temp 99.2 F (37.3 C) (Oral)   Resp 20   Ht 5\' 4"  (1.626 m)   Wt 104.3 kg   LMP 10/31/2023   SpO2 97%   BMI 39.48 kg/m  Physical Exam Vitals and nursing note reviewed.  Constitutional:      General: She is not in acute  distress.    Appearance: Normal appearance. She is not ill-appearing.  HENT:     Right Ear: Tympanic membrane and ear canal normal.     Left Ear: Tympanic membrane and ear canal normal.     Mouth/Throat:     Mouth: Mucous membranes are moist.  Cardiovascular:     Rate and Rhythm: Normal rate and regular rhythm.     Pulses: Normal pulses.  Pulmonary:     Effort: Respiratory distress present.     Breath sounds: Wheezing present. No rhonchi or rales.     Comments: Labored breathing.  Expiratory wheezing throughout Abdominal:     Palpations: Abdomen is soft.     Tenderness: There is no abdominal tenderness.  Skin:    General: Skin is warm.     Capillary Refill: Capillary refill takes less than 2 seconds.  Neurological:     General: No focal deficit present.     Mental Status: She is alert.     ED Results / Procedures / Treatments   Labs (all labs ordered are listed, but only abnormal results are displayed) Labs Reviewed  CBC WITH DIFFERENTIAL/PLATELET - Abnormal; Notable for the following components:      Result Value   Hemoglobin 11.9 (*)    Eosinophils Absolute 0.6 (*)    All other components within normal limits  BASIC METABOLIC PANEL - Abnormal; Notable for the following components:   Sodium 133 (*)    Glucose, Bld 110 (*)    Calcium 8.7 (*)    All other components within normal limits  RESP PANEL BY RT-PCR (RSV, FLU A&B, COVID)  RVPGX2    EKG None  Radiology No results found.  Procedures Procedures    Medications Ordered in ED Medications  methylPREDNISolone sodium succinate (SOLU-MEDROL) 125 mg/2 mL injection 125 mg (125 mg Intravenous Given 11/21/23 1655)  ipratropium (ATROVENT) nebulizer solution 0.5 mg (0.5 mg Nebulization Given 11/21/23 1647)  albuterol (PROVENTIL) (2.5 MG/3ML) 0.083% nebulizer solution 5 mg (5 mg Nebulization Given 11/21/23 1647)  albuterol (VENTOLIN HFA) 108 (90 Base) MCG/ACT inhaler 2 puff (2 puffs Inhalation Given 11/21/23 1940)     ED Course/ Medical Decision Making/ A&P                                 Medical Decision Making Pt here with cough, actively wheezing and shortness of breath.  Her child recently with similar sx's.  No fever or hypoxia.  Actively wheezing.  Suspect viral illness.  PNA, PE also considered.  PERC negative  Amount and/or Complexity of Data Reviewed Labs: ordered.    Details: Labs unremarkable Radiology: ordered.    Details: CXR unremarkable Discussion of management or test interpretation with external provider(s): Asthma exacerbation improved after neb and steroid.  Pt reports feeling better.  Lung sounds improved.  Pt ambulated in the dept w/o difficulty or hypoxia.   Pt appropriate for d/c home.  Return precautions discussed.    Risk Prescription drug management.           Final Clinical Impression(s) / ED Diagnoses Final diagnoses:  Mild asthma with exacerbation, unspecified whether persistent    Rx / DC Orders ED Discharge Orders          Ordered    predniSONE (DELTASONE) 20 MG tablet  Daily        11/21/23 1922    benzonatate (TESSALON) 100 MG capsule  3 times daily PRN        11/21/23 1922              Pauline Aus, PA-C 11/24/23 1954    Lonell Grandchild, MD 11/26/23 [phone]
